# Patient Record
Sex: Female | Born: 1960 | Race: White | Hispanic: No | Smoking: Never smoker
Health system: Southern US, Community
[De-identification: ages and names within clinical notes are randomized; demographics above are authoritative.]

## PROBLEM LIST (undated history)

## (undated) DIAGNOSIS — F32A Depression, unspecified: Secondary | ICD-10-CM

## (undated) DIAGNOSIS — K219 Gastro-esophageal reflux disease without esophagitis: Secondary | ICD-10-CM

## (undated) DIAGNOSIS — Z21 Asymptomatic human immunodeficiency virus [HIV] infection status: Secondary | ICD-10-CM

## (undated) DIAGNOSIS — B20 Human immunodeficiency virus [HIV] disease: Secondary | ICD-10-CM

## (undated) DIAGNOSIS — Z972 Presence of dental prosthetic device (complete) (partial): Secondary | ICD-10-CM

## (undated) HISTORY — PX: CHOLECYSTECTOMY: SHX55

## (undated) HISTORY — PX: COLONOSCOPY: SHX174

---

## 2004-11-07 ENCOUNTER — Emergency Department: Payer: Self-pay | Admitting: Emergency Medicine

## 2008-03-18 ENCOUNTER — Emergency Department: Payer: Self-pay | Admitting: Emergency Medicine

## 2008-07-09 ENCOUNTER — Ambulatory Visit: Payer: Self-pay | Admitting: Family Medicine

## 2008-07-23 ENCOUNTER — Ambulatory Visit: Payer: Self-pay | Admitting: Family Medicine

## 2009-10-17 ENCOUNTER — Emergency Department: Payer: Self-pay | Admitting: Emergency Medicine

## 2011-02-28 ENCOUNTER — Emergency Department: Payer: Self-pay | Admitting: *Deleted

## 2011-08-30 ENCOUNTER — Ambulatory Visit: Payer: Self-pay | Admitting: Family Medicine

## 2013-03-17 ENCOUNTER — Ambulatory Visit: Payer: Self-pay | Admitting: Family Medicine

## 2015-02-03 ENCOUNTER — Other Ambulatory Visit: Payer: Self-pay | Admitting: Student

## 2015-02-03 DIAGNOSIS — R945 Abnormal results of liver function studies: Principal | ICD-10-CM

## 2015-02-03 DIAGNOSIS — R7989 Other specified abnormal findings of blood chemistry: Secondary | ICD-10-CM

## 2015-02-10 ENCOUNTER — Ambulatory Visit
Admission: RE | Admit: 2015-02-10 | Discharge: 2015-02-10 | Disposition: A | Discharge status: Home / Self Care | Payer: Medicare Other | Source: Ambulatory Visit | Attending: Student | Admitting: Student

## 2015-02-10 DIAGNOSIS — R945 Abnormal results of liver function studies: Secondary | ICD-10-CM | POA: Diagnosis not present

## 2015-02-10 DIAGNOSIS — K760 Fatty (change of) liver, not elsewhere classified: Secondary | ICD-10-CM | POA: Insufficient documentation

## 2015-02-10 DIAGNOSIS — R7989 Other specified abnormal findings of blood chemistry: Secondary | ICD-10-CM

## 2015-02-25 ENCOUNTER — Other Ambulatory Visit: Payer: Self-pay | Admitting: Family Medicine

## 2015-02-25 DIAGNOSIS — Z1239 Encounter for other screening for malignant neoplasm of breast: Secondary | ICD-10-CM

## 2015-03-09 ENCOUNTER — Ambulatory Visit: Payer: Medicare Other

## 2016-05-15 ENCOUNTER — Other Ambulatory Visit: Payer: Self-pay | Admitting: Family Medicine

## 2016-05-15 DIAGNOSIS — Z1231 Encounter for screening mammogram for malignant neoplasm of breast: Secondary | ICD-10-CM

## 2016-06-09 ENCOUNTER — Ambulatory Visit
Admission: RE | Admit: 2016-06-09 | Discharge: 2016-06-09 | Disposition: A | Discharge status: Home / Self Care | Payer: Medicare Other | Source: Ambulatory Visit | Attending: Family Medicine | Admitting: Family Medicine

## 2016-06-09 DIAGNOSIS — Z1231 Encounter for screening mammogram for malignant neoplasm of breast: Secondary | ICD-10-CM | POA: Diagnosis not present

## 2016-09-12 ENCOUNTER — Other Ambulatory Visit: Payer: Self-pay | Admitting: Family Medicine

## 2016-09-12 DIAGNOSIS — B182 Chronic viral hepatitis C: Secondary | ICD-10-CM

## 2016-09-21 ENCOUNTER — Ambulatory Visit
Admission: RE | Admit: 2016-09-21 | Discharge: 2016-09-21 | Disposition: A | Discharge status: Home / Self Care | Payer: Medicare Other | Source: Ambulatory Visit | Attending: Family Medicine | Admitting: Family Medicine

## 2016-09-21 DIAGNOSIS — B182 Chronic viral hepatitis C: Secondary | ICD-10-CM | POA: Diagnosis not present

## 2017-02-02 ENCOUNTER — Other Ambulatory Visit: Payer: Self-pay | Admitting: Family Medicine

## 2017-02-02 DIAGNOSIS — K7460 Unspecified cirrhosis of liver: Secondary | ICD-10-CM

## 2017-03-27 ENCOUNTER — Ambulatory Visit: Payer: Medicare Other

## 2017-04-04 ENCOUNTER — Ambulatory Visit
Admission: RE | Admit: 2017-04-04 | Discharge: 2017-04-04 | Disposition: A | Discharge status: Home / Self Care | Payer: Medicare Other | Source: Ambulatory Visit | Attending: Family Medicine | Admitting: Family Medicine

## 2017-04-04 DIAGNOSIS — B182 Chronic viral hepatitis C: Secondary | ICD-10-CM | POA: Diagnosis not present

## 2017-04-04 DIAGNOSIS — K7460 Unspecified cirrhosis of liver: Secondary | ICD-10-CM

## 2017-11-02 ENCOUNTER — Other Ambulatory Visit: Payer: Self-pay | Admitting: Family Medicine

## 2017-11-02 DIAGNOSIS — Z1231 Encounter for screening mammogram for malignant neoplasm of breast: Secondary | ICD-10-CM

## 2018-03-30 IMAGING — MG MM DIGITAL SCREENING BILAT W/ TOMO W/ CAD
8 of 12 series · 8 of 28 positions shown · non-contrast
Comparison: Previous exam(s).

CLINICAL DATA: Screening.

EXAM:
2D DIGITAL SCREENING BILATERAL MAMMOGRAM WITH CAD AND ADJUNCT TOMO

[L CC]
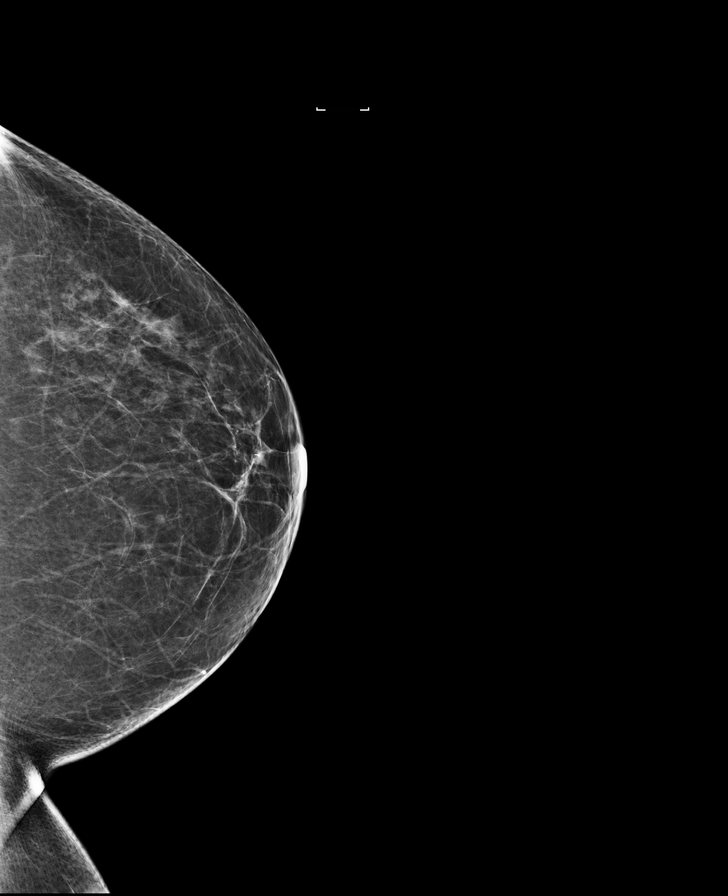

[R CC]
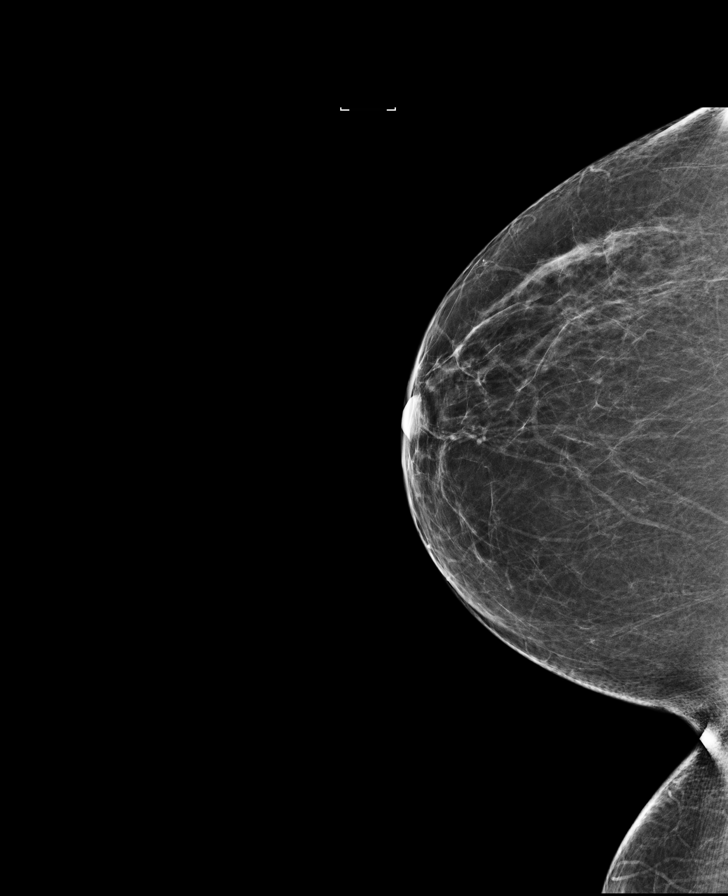

[L CC synth-2D]
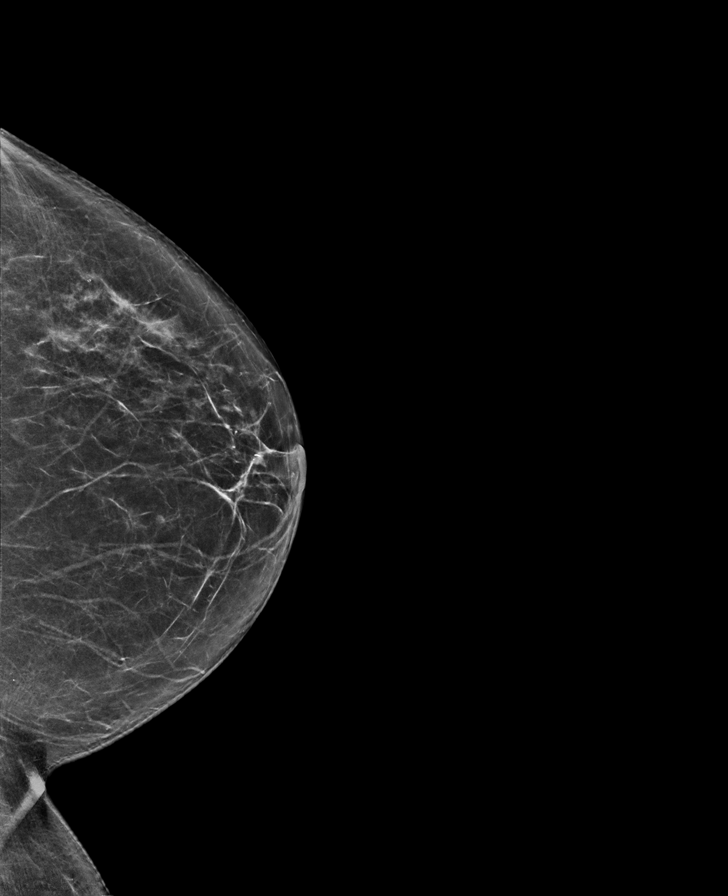

[R MLO]
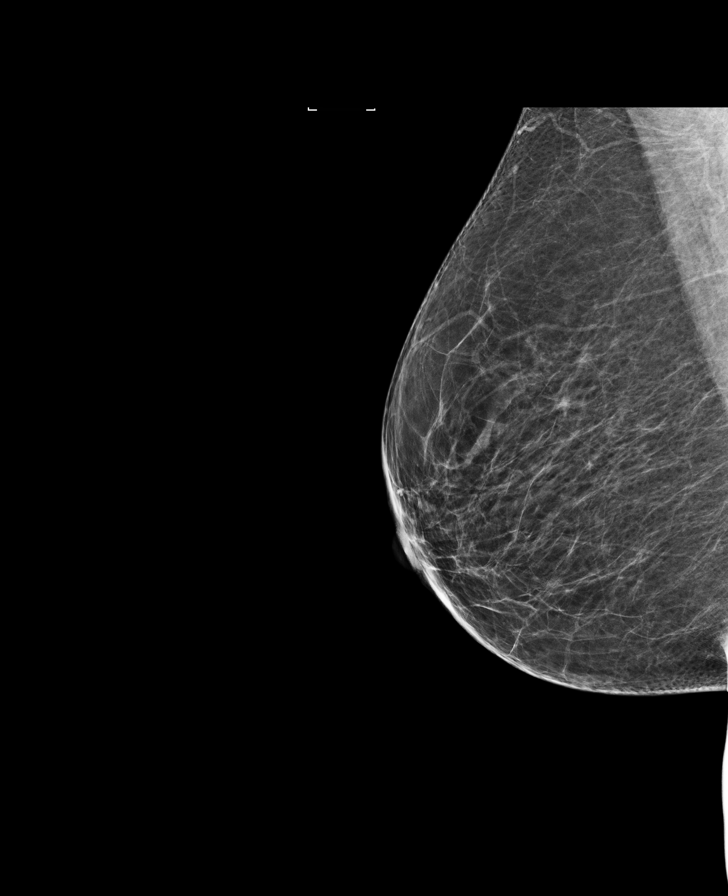

[R CC synth-2D]
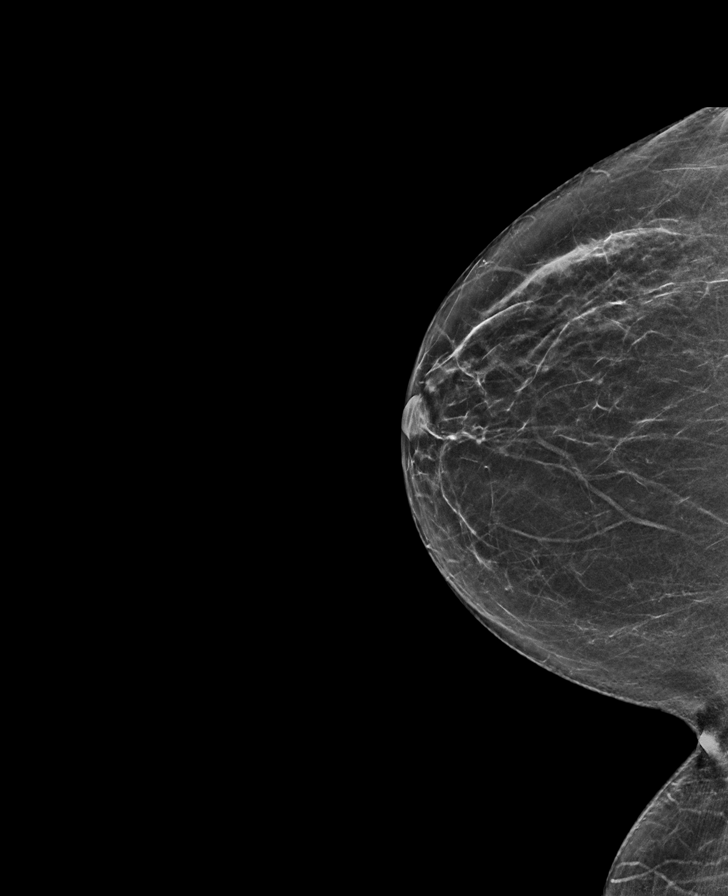

[L MLO]
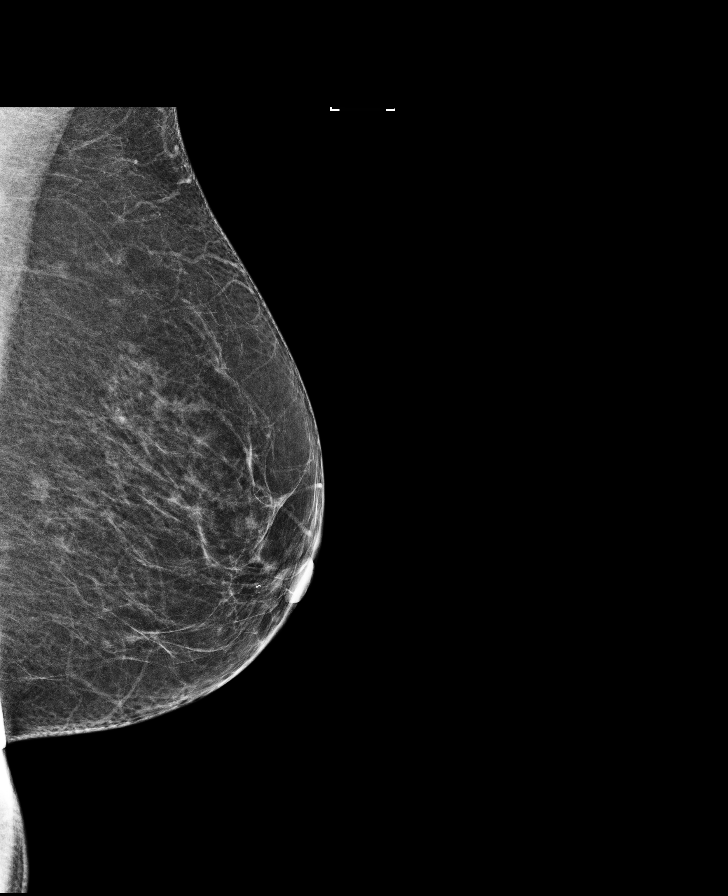

[R MLO synth-2D]
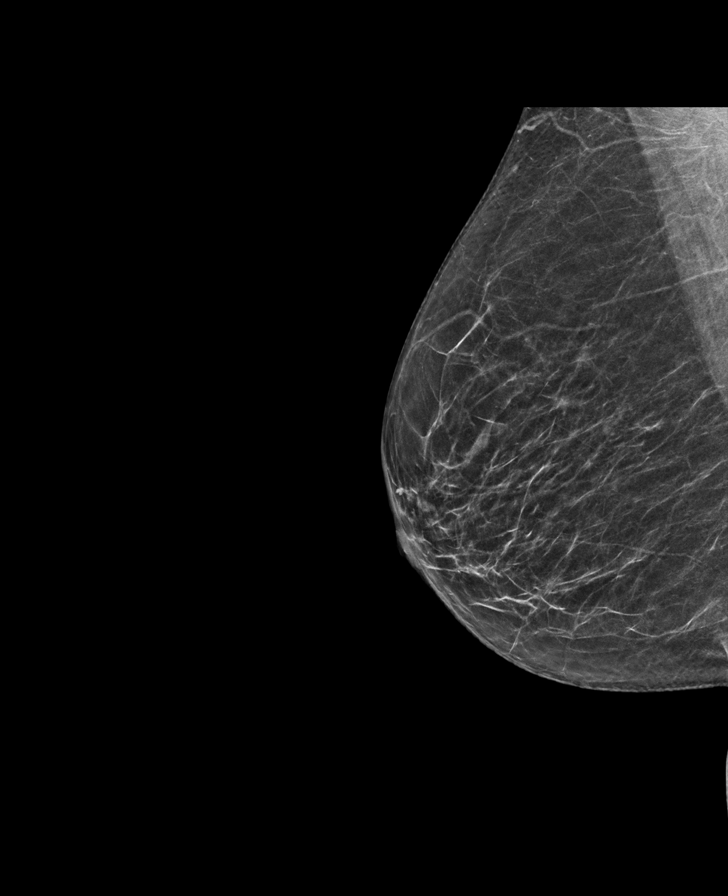

[L MLO synth-2D]
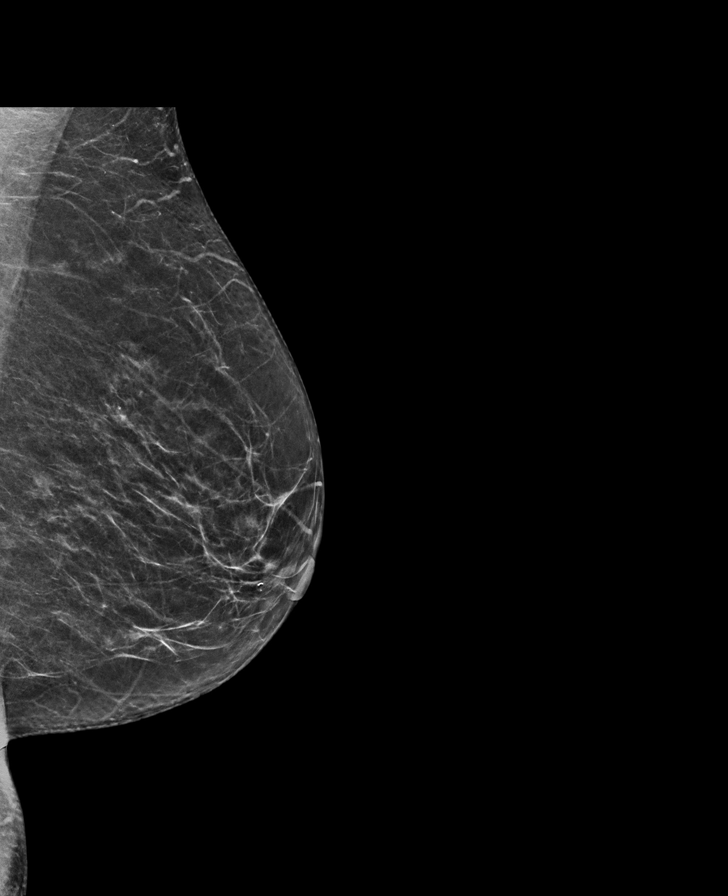

[8 of 28 positions shown; findings below may reference images not displayed]

ACR Breast Density Category b: There are scattered areas of
fibroglandular density.
FINDINGS: There are no findings suspicious for malignancy. Images were
processed with CAD.
IMPRESSION: No mammographic evidence of malignancy. A result letter of this
screening mammogram will be mailed directly to the patient.

RECOMMENDATION:
Screening mammogram in one year. (Code:97-6-RS4)

BI-RADS CATEGORY  1: Negative.

## 2018-08-05 ENCOUNTER — Other Ambulatory Visit: Payer: Self-pay | Admitting: Registered Nurse

## 2018-08-05 DIAGNOSIS — Z8719 Personal history of other diseases of the digestive system: Secondary | ICD-10-CM

## 2018-08-13 ENCOUNTER — Other Ambulatory Visit: Payer: Self-pay

## 2018-08-13 ENCOUNTER — Ambulatory Visit
Admission: RE | Admit: 2018-08-13 | Discharge: 2018-08-13 | Disposition: A | Discharge status: Home / Self Care | Payer: Medicare Other | Source: Ambulatory Visit | Attending: Registered Nurse | Admitting: Registered Nurse

## 2018-08-13 DIAGNOSIS — Z8719 Personal history of other diseases of the digestive system: Secondary | ICD-10-CM | POA: Diagnosis not present

## 2018-08-15 ENCOUNTER — Other Ambulatory Visit: Payer: Self-pay

## 2018-08-15 ENCOUNTER — Ambulatory Visit (INDEPENDENT_AMBULATORY_CARE_PROVIDER_SITE_OTHER): Payer: Medicare Other | Admitting: Gastroenterology

## 2018-08-15 ENCOUNTER — Encounter: Payer: Self-pay | Admitting: Gastroenterology

## 2018-08-15 VITALS — BP 121/83 | HR 64 | Resp 16 | Ht 62.0 in | Wt 179.4 lb

## 2018-08-15 DIAGNOSIS — K219 Gastro-esophageal reflux disease without esophagitis: Secondary | ICD-10-CM | POA: Diagnosis not present

## 2018-08-15 DIAGNOSIS — R1310 Dysphagia, unspecified: Secondary | ICD-10-CM

## 2018-08-15 DIAGNOSIS — R1319 Other dysphagia: Secondary | ICD-10-CM

## 2018-08-15 NOTE — Patient Instructions (Signed)

## 2018-08-15 NOTE — Progress Notes (Addendum)
Cephas Darby, MD [address]  [place]  [place], [place] [address]  Main: [phone]  Fax: [phone]    Gastroenterology Consultation  Referring Provider:     Alwyn Pea, NP Primary Care Physician:  Destiny Median, MD Primary Gastroenterologist:  Dr. Cephas Darby Reason for Consultation:  Reflux, dysphagia to solids        HPI:   Destiny Hernandez is a 58 y.o. female referred by Dr. Rebeca Hernandez, Durene Cal, MD  for consultation & management of chronic reflux   Patient reports that she has been experiencing several years history of heartburn for which she was taking Nexium 40 mg which kept her symptoms in remission.  She was told that she has hiatal hernia after she underwent EGD in 02/2015.  For the last 2 weeks, her reflux symptoms have worsened, including nocturnal heartburn that causes awakening from her sleep, she has been experiencing sensation of food stuck in her chest particularly with hard meats.  She does consume carbonated beverages, sweetened tea regularly.  She does report burping, belching, abdominal distention.  Her PCP has tried 2 different PPIs which did not provide relief.  Changed to a different one which she has not picked up yet.  She does not know the name of the PPI she is on.  She denies weight loss, nausea or vomiting.  She also reports having had a colonoscopy about 4 to 5 years ago.  Report not available.  She said she had labs performed 2 weeks ago by her PCP, I do not have copy of the results.  She does have fatty liver, elevated LFTs in the past from 01/2015.  She had an ultrasound abdomen on 08/13/2018 which revealed fatty liver only, no splenomegaly, patent portal vasculature  NSAIDs: None  Antiplts/Anticoagulants/Anti thrombotics: None  GI Procedures: EGD and colonoscopy in 2016, report not available  No past medical history on file.    Current Outpatient Medications:  .  esomeprazole (NEXIUM) 40 MG capsule, , Disp: , Rfl:   .  traZODone (DESYREL) 50 MG tablet, , Disp: , Rfl:  .  gabapentin (NEURONTIN) 300 MG capsule, Take by mouth., Disp: , Rfl:  .  nitrofurantoin, macrocrystal-monohydrate, (MACROBID) 100 MG capsule, , Disp: , Rfl:  .  oseltamivir (TAMIFLU) 75 MG capsule, , Disp: , Rfl:    No family history on file.   Social History   Tobacco Use  . Smoking status: Never Smoker  . Smokeless tobacco: Never Used  Substance Use Topics  . Alcohol use: Never    Frequency: Never  . Drug use: Never    Allergies as of 08/15/2018  . (No Known Allergies)    Review of Systems:    All systems reviewed and negative except where noted in HPI.   Physical Exam:  BP 121/83 (BP Location: Left Arm, Patient Position: Sitting, Cuff Size: Large)   Pulse 64   Resp 16   Ht 5\' 2"  (1.575 m)   Wt 179 lb 6.4 oz (81.4 kg)   BMI 32.81 kg/m  No LMP recorded.  General:   Hernandez,  Well-developed, well-nourished, pleasant and cooperative in NAD Head:  Normocephalic and atraumatic. Eyes:  Sclera clear, no icterus.   Conjunctiva pink. Ears:  Normal auditory acuity. Nose:  No deformity, discharge, or lesions. Mouth:  No deformity or lesions,oropharynx pink & moist. Neck:  Supple; no masses or thyromegaly. Lungs:  Respirations even and unlabored.  Clear throughout to auscultation.   No wheezes, crackles,  or rhonchi. No acute distress. Heart:  Regular rate and rhythm; no murmurs, clicks, rubs, or gallops. Abdomen:  Normal bowel sounds. Soft, non-tender and non-distended without masses, hepatosplenomegaly or hernias noted.  No guarding or rebound tenderness.   Rectal: Not performed Msk:  Symmetrical without gross deformities. Good, equal movement & strength bilaterally. Pulses:  Normal pulses noted. Extremities:  No clubbing or edema.  No cyanosis. Neurologic:  Hernandez and oriented x3;  grossly normal neurologically. Skin:  Intact without significant lesions or rashes. No jaundice. Psych:  Hernandez and cooperative. Normal mood  and affect.  Imaging Studies: Reviewed  Assessment and Plan:   Destiny Hernandez is a 58 y.o. female with chronic reflux, fatty liver presents with flareup of reflux, dysphagia to solids particularly hard meat  GERD, dysphagia Recommend to continue PPI as prescribed by her PCP Recommend EGD for further evaluation  Elevated LFTs, ultrasound revealed fatty liver We will obtain copy of the most recent lab results from her PCPs office Further work-up after reviewing the labs  Colon cancer screening We will request copy of colonoscopy from 2016   Follow up in 2 months   Cephas Darby, MD

## 2018-08-20 ENCOUNTER — Telehealth: Payer: Self-pay | Admitting: Gastroenterology

## 2018-08-20 ENCOUNTER — Telehealth: Payer: Self-pay

## 2018-08-20 ENCOUNTER — Other Ambulatory Visit: Payer: Self-pay

## 2018-08-20 DIAGNOSIS — R1319 Other dysphagia: Secondary | ICD-10-CM

## 2018-08-20 DIAGNOSIS — R1310 Dysphagia, unspecified: Secondary | ICD-10-CM

## 2018-08-20 DIAGNOSIS — K219 Gastro-esophageal reflux disease without esophagitis: Secondary | ICD-10-CM

## 2018-08-20 NOTE — Telephone Encounter (Signed)
Patients EGD has been rescheduled to [place] for 09/04/18.  Procedure moved from 08/26/18 [facility].  Thanks [employer]

## 2018-08-20 NOTE — Telephone Encounter (Signed)
Pt left vm she is returning  A phone call

## 2018-08-20 NOTE — Telephone Encounter (Signed)
Pt spoke with Sharyn Lull concerning rescheduling procedure previously scheduled for 08/26/2018

## 2018-08-26 ENCOUNTER — Ambulatory Visit: Admit: 2018-08-26 | Payer: Medicare Other | Admitting: Gastroenterology

## 2018-08-26 ENCOUNTER — Ambulatory Visit: Payer: Medicare Other | Admitting: Gastroenterology

## 2018-08-26 SURGERY — ESOPHAGOGASTRODUODENOSCOPY (EGD) WITH PROPOFOL
Anesthesia: General

## 2018-08-27 ENCOUNTER — Telehealth: Payer: Self-pay | Admitting: Gastroenterology

## 2018-08-27 NOTE — Telephone Encounter (Signed)
Doree Albee sister is calling trying to help patient who does not understand the dates or instructions. She is currently scheduled for a procedure in the [place] office on 09-04-2018. Helene Kelp is on the [facility] for patient. Please advise.

## 2018-08-27 NOTE — Telephone Encounter (Signed)
Lowanda Foster called about patient & she is on patient's DPR. I have called & l/m for Lowanda Foster to return call.

## 2018-08-27 NOTE — Telephone Encounter (Signed)
Procedure date provided.  COVID testing information provided.  She said they may need to reschedule due to family matters going on.  Will call back if they need to.  Thanks [employer]

## 2018-08-29 ENCOUNTER — Encounter: Payer: Self-pay | Admitting: *Deleted

## 2018-08-29 ENCOUNTER — Other Ambulatory Visit: Payer: Self-pay

## 2018-08-30 ENCOUNTER — Other Ambulatory Visit
Admission: RE | Admit: 2018-08-30 | Discharge: 2018-08-30 | Disposition: A | Discharge status: Home / Self Care | Payer: Medicare Other | Source: Ambulatory Visit | Attending: Gastroenterology | Admitting: Gastroenterology

## 2018-08-30 DIAGNOSIS — Z1159 Encounter for screening for other viral diseases: Secondary | ICD-10-CM | POA: Diagnosis not present

## 2018-08-30 DIAGNOSIS — Z01812 Encounter for preprocedural laboratory examination: Secondary | ICD-10-CM | POA: Insufficient documentation

## 2018-08-31 LAB — NOVEL CORONAVIRUS, NAA (HOSP ORDER, SEND-OUT TO REF LAB; TAT 18-24 HRS): SARS-CoV-2, NAA: NOT DETECTED

## 2018-09-03 NOTE — Discharge Instructions (Signed)
General Anesthesia, Adult, Care After  This sheet gives you information about how to care for yourself after your procedure. Your health care provider may also give you more specific instructions. If you have problems or questions, contact your health care provider.  What can I expect after the procedure?  After the procedure, the following side effects are common:  Pain or discomfort at the IV site.  Nausea.  Vomiting.  Sore throat.  Trouble concentrating.  Feeling cold or chills.  Weak or tired.  Sleepiness and fatigue.  Soreness and body aches. These side effects can affect parts of the body that were not involved in surgery.  Follow these instructions at home:    For at least 24 hours after the procedure:  Have a responsible adult stay with you. It is important to have someone help care for you until you are awake and alert.  Rest as needed.  Do not:  Participate in activities in which you could fall or become injured.  Drive.  Use heavy machinery.  Drink alcohol.  Take sleeping pills or medicines that cause drowsiness.  Make important decisions or sign legal documents.  Take care of children on your own.  Eating and drinking  Follow any instructions from your health care provider about eating or drinking restrictions.  When you feel hungry, start by eating small amounts of foods that are soft and easy to digest (bland), such as toast. Gradually return to your regular diet.  Drink enough fluid to keep your urine pale yellow.  If you vomit, rehydrate by drinking water, juice, or clear broth.  General instructions  If you have sleep apnea, surgery and certain medicines can increase your risk for breathing problems. Follow instructions from your health care provider about wearing your sleep device:  Anytime you are sleeping, including during daytime naps.  While taking prescription pain medicines, sleeping medicines, or medicines that make you drowsy.  Return to your normal activities as told by your health care  provider. Ask your health care provider what activities are safe for you.  Take over-the-counter and prescription medicines only as told by your health care provider.  If you smoke, do not smoke without supervision.  Keep all follow-up visits as told by your health care provider. This is important.  Contact a health care provider if:  You have nausea or vomiting that does not get better with medicine.  You cannot eat or drink without vomiting.  You have pain that does not get better with medicine.  You are unable to pass urine.  You develop a skin rash.  You have a fever.  You have redness around your IV site that gets worse.  Get help right away if:  You have difficulty breathing.  You have chest pain.  You have blood in your urine or stool, or you vomit blood.  Summary  After the procedure, it is common to have a sore throat or nausea. It is also common to feel tired.  Have a responsible adult stay with you for the first 24 hours after general anesthesia. It is important to have someone help care for you until you are awake and alert.  When you feel hungry, start by eating small amounts of foods that are soft and easy to digest (bland), such as toast. Gradually return to your regular diet.  Drink enough fluid to keep your urine pale yellow.  Return to your normal activities as told by your health care provider. Ask your health care   provider what activities are safe for you.  This information is not intended to replace advice given to you by your health care provider. Make sure you discuss any questions you have with your health care provider.  Document Released: 06/12/2000 Document Revised: 10/20/2016 Document Reviewed: 10/20/2016  Elsevier Interactive Patient Education  2019 [employer] Inc[employer]

## 2018-09-04 ENCOUNTER — Ambulatory Visit: Payer: Medicare Other | Admitting: Anesthesiology

## 2018-09-04 ENCOUNTER — Ambulatory Visit
Admission: RE | Admit: 2018-09-04 | Discharge: 2018-09-04 | Disposition: A | Discharge status: Home / Self Care | Payer: Medicare Other | Attending: Gastroenterology | Admitting: Gastroenterology

## 2018-09-04 ENCOUNTER — Other Ambulatory Visit: Payer: Self-pay

## 2018-09-04 ENCOUNTER — Encounter
Admission: RE | Disposition: A | Discharge status: Home / Self Care | Payer: Self-pay | Source: Home / Self Care | Attending: Gastroenterology

## 2018-09-04 DIAGNOSIS — K219 Gastro-esophageal reflux disease without esophagitis: Secondary | ICD-10-CM

## 2018-09-04 DIAGNOSIS — Z79899 Other long term (current) drug therapy: Secondary | ICD-10-CM | POA: Diagnosis not present

## 2018-09-04 DIAGNOSIS — R1319 Other dysphagia: Secondary | ICD-10-CM

## 2018-09-04 DIAGNOSIS — Z21 Asymptomatic human immunodeficiency virus [HIV] infection status: Secondary | ICD-10-CM | POA: Insufficient documentation

## 2018-09-04 DIAGNOSIS — R1310 Dysphagia, unspecified: Secondary | ICD-10-CM | POA: Diagnosis not present

## 2018-09-04 HISTORY — DX: Presence of dental prosthetic device (complete) (partial): Z97.2

## 2018-09-04 HISTORY — DX: Asymptomatic human immunodeficiency virus (hiv) infection status: Z21

## 2018-09-04 HISTORY — DX: Human immunodeficiency virus (HIV) disease: B20

## 2018-09-04 HISTORY — PX: ESOPHAGOGASTRODUODENOSCOPY (EGD) WITH PROPOFOL: SHX5813

## 2018-09-04 SURGERY — ESOPHAGOGASTRODUODENOSCOPY (EGD) WITH PROPOFOL
Anesthesia: General

## 2018-09-04 MED ORDER — DEXMEDETOMIDINE HCL 200 MCG/2ML IV SOLN
INTRAVENOUS | Status: DC | PRN
  Administered 2018-09-04: 6 ug via INTRAVENOUS

## 2018-09-04 MED ORDER — LIDOCAINE HCL (CARDIAC) PF 100 MG/5ML IV SOSY
PREFILLED_SYRINGE | INTRAVENOUS | Status: DC | PRN
  Administered 2018-09-04: 40 mg via INTRAVENOUS

## 2018-09-04 MED ORDER — LACTATED RINGERS IV SOLN
INTRAVENOUS | Status: DC
  Administered 2018-09-04: 08:00:00 via INTRAVENOUS

## 2018-09-04 MED ORDER — PROPOFOL 10 MG/ML IV BOLUS
INTRAVENOUS | Status: DC | PRN
  Administered 2018-09-04 (×2): 50 mg via INTRAVENOUS
  Administered 2018-09-04: 20 mg via INTRAVENOUS
  Administered 2018-09-04: 100 mg via INTRAVENOUS

## 2018-09-04 MED ORDER — STERILE WATER FOR IRRIGATION IR SOLN
Status: DC | PRN
  Administered 2018-09-04: 5 mL

## 2018-09-04 MED ORDER — OXYCODONE HCL 5 MG/5ML PO SOLN: 5.0000 mg | Freq: Once | ORAL | Status: DC | PRN

## 2018-09-04 MED ORDER — GLYCOPYRROLATE 0.2 MG/ML IJ SOLN
INTRAMUSCULAR | Status: DC | PRN
  Administered 2018-09-04: 0.1 mg via INTRAVENOUS

## 2018-09-04 MED ORDER — OXYCODONE HCL 5 MG PO TABS: 5.0000 mg | ORAL_TABLET | Freq: Once | ORAL | Status: DC | PRN

## 2018-09-04 SURGICAL SUPPLY — 33 items
BALLN DILATOR 10-12 8 (BALLOONS)
BALLN DILATOR 12-15 8 (BALLOONS)
BALLN DILATOR 15-18 8 (BALLOONS)
BALLN DILATOR CRE 0-12 8 (BALLOONS)
BALLN DILATOR ESOPH 8 10 CRE (MISCELLANEOUS) IMPLANT
BALLOON DILATOR 12-15 8 (BALLOONS) IMPLANT
BALLOON DILATOR 15-18 8 (BALLOONS) IMPLANT
BALLOON DILATOR CRE 0-12 8 (BALLOONS) IMPLANT
BLOCK BITE 60FR ADLT L/F GRN (MISCELLANEOUS) ×3 IMPLANT
CANISTER SUCT 1200ML W/VALVE (MISCELLANEOUS) ×3 IMPLANT
CLIP HMST 235XBRD CATH ROT (MISCELLANEOUS) IMPLANT
CLIP RESOLUTION 360 11X235 (MISCELLANEOUS)
ELECT REM PT RETURN 9FT ADLT (ELECTROSURGICAL)
ELECTRODE REM PT RTRN 9FT ADLT (ELECTROSURGICAL) IMPLANT
FCP ESCP3.2XJMB 240X2.8X (MISCELLANEOUS) ×1
FORCEPS BIOP RAD 4 LRG CAP 4 (CUTTING FORCEPS) IMPLANT
FORCEPS BIOP RJ4 240 W/NDL (MISCELLANEOUS) ×2
FORCEPS ESCP3.2XJMB 240X2.8X (MISCELLANEOUS) ×1 IMPLANT
GOWN CVR UNV OPN BCK APRN NK (MISCELLANEOUS) ×2 IMPLANT
GOWN ISOL THUMB LOOP REG UNIV (MISCELLANEOUS) ×4
INJECTOR VARIJECT VIN23 (MISCELLANEOUS) IMPLANT
KIT DEFENDO VALVE AND CONN (KITS) IMPLANT
KIT ENDO PROCEDURE OLY (KITS) ×3 IMPLANT
MARKER SPOT ENDO TATTOO 5ML (MISCELLANEOUS) IMPLANT
RETRIEVER NET PLAT FOOD (MISCELLANEOUS) IMPLANT
SNARE SHORT THROW 13M SML OVAL (MISCELLANEOUS) IMPLANT
SNARE SHORT THROW 30M LRG OVAL (MISCELLANEOUS) IMPLANT
SPOT EX ENDOSCOPIC TATTOO (MISCELLANEOUS)
SYR INFLATION 60ML (SYRINGE) IMPLANT
TRAP ETRAP POLY (MISCELLANEOUS) IMPLANT
VARIJECT INJECTOR VIN23 (MISCELLANEOUS)
WATER STERILE IRR 250ML POUR (IV SOLUTION) ×3 IMPLANT
WIRE CRE 18-20MM 8CM F G (MISCELLANEOUS) IMPLANT

## 2018-09-04 NOTE — Anesthesia Preprocedure Evaluation (Signed)
Anesthesia Evaluation  Patient identified by MRN, date of birth, ID band  Reviewed: NPO status   History of Anesthesia Complications Negative for: history of anesthetic complications  Airway Mallampati: II  TM Distance: >3 FB Neck ROM: full    Dental  (+) Upper Dentures, Lower Dentures   Pulmonary neg pulmonary ROS,    Pulmonary exam normal        Cardiovascular Exercise Tolerance: Good negative cardio ROS Normal cardiovascular exam     Neuro/Psych negative neurological ROS  negative psych ROS   GI/Hepatic Neg liver ROS, GERD  Controlled,  Endo/Other  negative endocrine ROS  Renal/GU negative Renal ROS  negative genitourinary   Musculoskeletal   Abdominal   Peds  Hematology  (+) HIV,   Anesthesia Other Findings Covid: NEG;  Reproductive/Obstetrics                             Anesthesia Physical Anesthesia Plan  ASA: II  Anesthesia Plan: General   Post-op Pain Management:    Induction:   PONV Risk Score and Plan:   Airway Management Planned:   Additional Equipment:   Intra-op Plan:   Post-operative Plan:   Informed Consent: I have reviewed the patients History and Physical, chart, labs and discussed the procedure including the risks, benefits and alternatives for the proposed anesthesia with the patient or authorized representative who has indicated his/her understanding and acceptance.       Plan Discussed with: CRNA  Anesthesia Plan Comments:         Anesthesia Quick Evaluation

## 2018-09-04 NOTE — H&P (Signed)
Cephas Darby, MD [address]  [place]  [place], [place] [address]  Main: [phone]  Fax: [phone] Pager: [phone]  Primary Care Physician:  Letta Median, MD Primary Gastroenterologist:  Dr. Cephas Darby  Pre-Procedure History & Physical: HPI:  Destiny Hernandez is a 58 y.o. female is here for an endoscopy.   Past Medical History:  Diagnosis Date  . HIV (human immunodeficiency virus infection) ([place])   . Wears dentures    full upper and lower, doesn't wear    Past Surgical History:  Procedure Laterality Date  . COLONOSCOPY      Prior to Admission medications   Medication Sig Start Date End Date Taking? Authorizing Provider  ACAI BERRY PO Take by mouth daily.   Yes [provider]  bictegravir-emtricitabine-tenofovir AF (BIKTARVY) 50-200-25 MG TABS tablet Take by mouth daily.   Yes [provider]  omeprazole (PRILOSEC) 20 MG capsule Take 20 mg by mouth daily.   Yes [provider]  traZODone (DESYREL) 50 MG tablet  08/02/18  Yes [provider]  esomeprazole ([place]) 40 MG capsule  04/12/18   [provider]    Allergies as of 08/20/2018  . (No Known Allergies)    History reviewed. No pertinent family history.  Social History   Socioeconomic History  . Marital status: Divorced    Spouse name: Not on file  . Number of children: Not on file  . Years of education: Not on file  . Highest education level: Not on file  Occupational History  . Not on file  Social Needs  . Financial resource strain: Not on file  . Food insecurity    Worry: Not on file    Inability: Not on file  . Transportation needs    Medical: Not on file    Non-medical: Not on file  Tobacco Use  . Smoking status: Never Smoker  . Smokeless tobacco: Never Used  Substance and Sexual Activity  . Alcohol use: Never    Frequency: Never  . Drug use: Never  . Sexual activity: Not Currently    Partners: Male  Lifestyle  .  Physical activity    Days per week: Not on file    Minutes per session: Not on file  . Stress: Not on file  Relationships  . Social Herbalist on phone: Not on file    Gets together: Not on file    Attends religious service: Not on file    Active member of club or organization: Not on file    Attends meetings of clubs or organizations: Not on file    Relationship status: Not on file  . Intimate partner violence    Fear of current or ex partner: Not on file    Emotionally abused: Not on file    Physically abused: Not on file    Forced sexual activity: Not on file  Other Topics Concern  . Not on file  Social History Narrative  . Not on file    Review of Systems: See HPI, otherwise negative ROS  Physical Exam: BP (!) 157/75   Pulse 71   Temp (!) 97.5 F (36.4 C) (Temporal)   Resp 16   Ht 5\' 2"  (1.575 m)   Wt 81.6 kg   SpO2 99%   BMI 32.90 kg/m  General:   Alert,  pleasant and cooperative in NAD Head:  Normocephalic and atraumatic. Neck:  Supple; no masses or thyromegaly. Lungs:  Clear  throughout to auscultation.    Heart:  Regular rate and rhythm. Abdomen:  Soft, nontender and nondistended. Normal bowel sounds, without guarding, and without rebound.   Neurologic:  Alert and  oriented x4;  grossly normal neurologically.  Impression/Plan: Destiny Hernandez is here for an endoscopy to be performed for chronic gerd and dysphagia  Risks, benefits, limitations, and alternatives regarding  endoscopy have been reviewed with the patient.  Questions have been answered.  All parties agreeable.   Sherri Sear, MD  09/04/2018, 8:11 AM

## 2018-09-04 NOTE — Transfer of Care (Signed)
Immediate Anesthesia Transfer of Care Note  Patient: Destiny Hernandez  Procedure(s) Performed: ESOPHAGOGASTRODUODENOSCOPY (EGD) WITH PROPOFOL (N/A )  Patient Location: PACU  Anesthesia Type: General  Level of Consciousness: awake, alert  and patient cooperative  Airway and Oxygen Therapy: Patient Spontanous Breathing and Patient connected to supplemental oxygen  Post-op Assessment: Post-op Vital signs reviewed, Patient's Cardiovascular Status Stable, Respiratory Function Stable, Patent Airway and No signs of Nausea or vomiting  Post-op Vital Signs: Reviewed and stable  Complications: No apparent anesthesia complications

## 2018-09-04 NOTE — Anesthesia Procedure Notes (Signed)
Procedure Name: MAC Date/Time: 09/04/2018 8:50 AM Performed by: Janna Arch, CRNA Pre-anesthesia Checklist: Patient identified, Emergency Drugs available, Suction available, Timeout performed and Patient being monitored Patient Re-evaluated:Patient Re-evaluated prior to induction Oxygen Delivery Method: Nasal cannula Placement Confirmation: positive ETCO2

## 2018-09-04 NOTE — Anesthesia Postprocedure Evaluation (Signed)
Anesthesia Post Note  Patient: Destiny Hernandez  Procedure(s) Performed: ESOPHAGOGASTRODUODENOSCOPY (EGD) WITH PROPOFOL (N/A )  Patient location during evaluation: PACU Anesthesia Type: General Level of consciousness: awake and alert Pain management: pain level controlled Vital Signs Assessment: post-procedure vital signs reviewed and stable Respiratory status: spontaneous breathing, nonlabored ventilation, respiratory function stable and patient connected to nasal cannula oxygen Cardiovascular status: blood pressure returned to baseline and stable Postop Assessment: no apparent nausea or vomiting Anesthetic complications: no    Devonne Lalani

## 2018-09-04 NOTE — Op Note (Signed)
[facility] Gastroenterology Patient Name: Destiny Hernandez Procedure Date: 09/04/2018 8:46 AM MRN: [id number] Account #: [id number] Date of Birth: 02-08-1961 Admit Type: Outpatient Age: 58 Room: [facility] OR ROOM 01 Gender: Female Note Status: Finalized Procedure:            Upper GI endoscopy Indications:          Dysphagia, Esophageal reflux symptoms that persist                        despite appropriate therapy Providers:            Lin Landsman MD, MD Referring MD:         Baxter Kail. Rebeca Alert MD, MD (Referring MD) Medicines:            Monitored Anesthesia Care Complications:        No immediate complications. Estimated blood loss: None. Procedure:            Pre-Anesthesia Assessment:                       - Prior to the procedure, a History and Physical was                        performed, and patient medications and allergies were                        reviewed. The patient is competent. The risks and                        benefits of the procedure and the sedation options and                        risks were discussed with the patient. All questions                        were answered and informed consent was obtained.                        Patient identification and proposed procedure were                        verified by the physician, the nurse, the                        anesthesiologist, the anesthetist and the technician in                        the pre-procedure area in the procedure room in the                        endoscopy suite. Mental Status Examination: alert and                        oriented. Airway Examination: normal oropharyngeal                        airway and neck mobility. Respiratory Examination:                        clear to auscultation. CV Examination: normal.  Prophylactic Antibiotics: The patient does not require                        prophylactic antibiotics. Prior Anticoagulants: The                patient has taken no previous anticoagulant or                        antiplatelet agents. ASA Grade Assessment: II - A                        patient with mild systemic disease. After reviewing the                        risks and benefits, the patient was deemed in                        satisfactory condition to undergo the procedure. The                        anesthesia plan was to use monitored anesthesia care                        (MAC). Immediately prior to administration of                        medications, the patient was re-assessed for adequacy                        to receive sedatives. The heart rate, respiratory rate,                        oxygen saturations, blood pressure, adequacy of                        pulmonary ventilation, and response to care were                        monitored throughout the procedure. The physical status                        of the patient was re-assessed after the procedure.                       After obtaining informed consent, the endoscope was                        passed under direct vision. Throughout the procedure,                        the patient's blood pressure, pulse, and oxygen                        saturations were monitored continuously. The                        Endosonoscope was introduced through the mouth, and                        advanced to the second part of duodenum. The upper  GI                        endoscopy was accomplished without difficulty. The                        patient tolerated the procedure fairly well. Findings:      The duodenal bulb and second portion of the duodenum were normal.      The entire examined stomach was normal. Biopsies were taken with a cold       forceps for Helicobacter pylori testing.      The cardia and gastric fundus were normal on retroflexion.      Esophagogastric landmarks were identified: the gastroesophageal junction       was found at 36 cm from the  incisors.      The gastroesophageal junction and examined esophagus were normal.       Biopsies were taken with a cold forceps for histology. Impression:           - Normal duodenal bulb and second portion of the                        duodenum.                       - Normal stomach. Biopsied.                       - Esophagogastric landmarks identified.                       - Normal gastroesophageal junction and esophagus.                        Biopsied. Recommendation:       - Await pathology results.                       - Discharge patient to home (with escort).                       - Resume previous diet today.                       - Continue present medications.                       - Follow an antireflux regimen. Procedure Code(s):    --- Professional ---                       [phone], Esophagogastroduodenoscopy, flexible, transoral;                        with biopsy, single or multiple Diagnosis Code(s):    --- Professional ---                       R13.10, Dysphagia, unspecified                       K21.9, Gastro-esophageal reflux disease without                        esophagitis CPT copyright 2019 American Medical Association. All rights reserved. The codes documented in  this report are preliminary and upon coder review may  be revised to meet current compliance requirements. Dr. Ulyess Mort Lin Landsman MD, MD 09/04/2018 9:03:59 AM This report has been signed electronically. Number of Addenda: 0 Note Initiated On: 09/04/2018 8:46 AM Total Procedure Duration: 0 hours 5 minutes 58 seconds  Estimated Blood Loss: Estimated blood loss: none.      [facility]

## 2018-09-05 ENCOUNTER — Encounter: Payer: Self-pay | Admitting: Gastroenterology

## 2018-09-10 ENCOUNTER — Encounter: Payer: Self-pay | Admitting: Gastroenterology

## 2018-10-15 ENCOUNTER — Ambulatory Visit: Payer: Medicare Other | Admitting: Gastroenterology

## 2018-12-02 ENCOUNTER — Ambulatory Visit: Payer: Medicare Other | Admitting: Gastroenterology

## 2018-12-13 ENCOUNTER — Other Ambulatory Visit: Payer: Self-pay | Admitting: Registered Nurse

## 2018-12-13 DIAGNOSIS — Z1231 Encounter for screening mammogram for malignant neoplasm of breast: Secondary | ICD-10-CM

## 2019-04-15 ENCOUNTER — Ambulatory Visit
Admission: RE | Admit: 2019-04-15 | Discharge: 2019-04-15 | Disposition: A | Discharge status: Home / Self Care | Payer: Medicare Other | Source: Ambulatory Visit | Attending: Registered Nurse | Admitting: Registered Nurse

## 2019-04-15 DIAGNOSIS — Z1231 Encounter for screening mammogram for malignant neoplasm of breast: Secondary | ICD-10-CM | POA: Diagnosis not present

## 2019-05-14 ENCOUNTER — Other Ambulatory Visit: Payer: Self-pay

## 2019-05-14 ENCOUNTER — Telehealth: Payer: Self-pay

## 2019-05-14 ENCOUNTER — Encounter: Payer: Self-pay | Admitting: Physician Assistant

## 2019-05-14 DIAGNOSIS — Z1211 Encounter for screening for malignant neoplasm of colon: Secondary | ICD-10-CM

## 2019-05-14 DIAGNOSIS — R195 Other fecal abnormalities: Secondary | ICD-10-CM

## 2019-05-14 NOTE — Telephone Encounter (Signed)
Gastroenterology Pre-Procedure Review  Request Date: 06/16/19 Requesting Physician: Dr. Marius Ditch  PATIENT REVIEW QUESTIONS: The patient responded to the following health history questions as indicated:    1. Are you having any GI issues? no 2. Do you have a personal history of Polyps? no 3. Do you have a family history of Colon Cancer or Polyps? no 4. Diabetes Mellitus? no 5. Joint replacements in the past 12 months?no 6. Major health problems in the past 3 months?no 7. Any artificial heart valves, MVP, or defibrillator?no    MEDICATIONS & ALLERGIES:    Patient reports the following regarding taking any anticoagulation/antiplatelet therapy:   Plavix, Coumadin, Eliquis, Xarelto, Lovenox, Pradaxa, Brilinta, or Effient? no Aspirin? no  Patient confirms/reports the following medications:  Current Outpatient Medications  Medication Sig Dispense Refill  . ACAI BERRY PO Take by mouth daily.    . bictegravir-emtricitabine-tenofovir AF (BIKTARVY) 50-200-25 MG TABS tablet Take by mouth daily.    Marland Kitchen esomeprazole (NEXIUM) 40 MG capsule     . omeprazole (PRILOSEC) 20 MG capsule Take 20 mg by mouth daily.    . traZODone (DESYREL) 50 MG tablet      No current facility-administered medications for this visit.    Patient confirms/reports the following allergies:  No Known Allergies  No orders of the defined types were placed in this encounter.   AUTHORIZATION INFORMATION Primary Insurance: 1D#: Group #:  Secondary Insurance: 1D#: Group #:  SCHEDULE INFORMATION: Date: Friday 06/06/19 Time: Location:[facility]

## 2019-05-14 NOTE — Telephone Encounter (Signed)
Returned call to patient to schedule colonoscopy.  LVM for pt to call office to schedule.  Thanks,  [place], [place]

## 2019-06-04 ENCOUNTER — Other Ambulatory Visit: Payer: Self-pay

## 2019-06-04 ENCOUNTER — Other Ambulatory Visit
Admission: RE | Admit: 2019-06-04 | Discharge: 2019-06-04 | Disposition: A | Discharge status: Home / Self Care | Payer: Medicare Other | Source: Ambulatory Visit | Attending: Gastroenterology | Admitting: Gastroenterology

## 2019-06-04 DIAGNOSIS — Z01812 Encounter for preprocedural laboratory examination: Secondary | ICD-10-CM | POA: Insufficient documentation

## 2019-06-04 DIAGNOSIS — Z20822 Contact with and (suspected) exposure to covid-19: Secondary | ICD-10-CM | POA: Insufficient documentation

## 2019-06-04 LAB — SARS CORONAVIRUS 2 (TAT 6-24 HRS): SARS Coronavirus 2: NEGATIVE

## 2019-06-06 ENCOUNTER — Encounter: Payer: Self-pay | Admitting: Gastroenterology

## 2019-06-06 ENCOUNTER — Ambulatory Visit
Admission: RE | Admit: 2019-06-06 | Discharge: 2019-06-06 | Disposition: A | Discharge status: Home / Self Care | Payer: Medicare Other | Attending: Gastroenterology | Admitting: Gastroenterology

## 2019-06-06 ENCOUNTER — Ambulatory Visit: Payer: Medicare Other | Admitting: Registered Nurse

## 2019-06-06 ENCOUNTER — Other Ambulatory Visit: Payer: Self-pay

## 2019-06-06 ENCOUNTER — Encounter
Admission: RE | Disposition: A | Discharge status: Home / Self Care | Payer: Self-pay | Source: Home / Self Care | Attending: Gastroenterology

## 2019-06-06 DIAGNOSIS — Z1211 Encounter for screening for malignant neoplasm of colon: Secondary | ICD-10-CM

## 2019-06-06 DIAGNOSIS — K635 Polyp of colon: Secondary | ICD-10-CM

## 2019-06-06 DIAGNOSIS — R195 Other fecal abnormalities: Secondary | ICD-10-CM

## 2019-06-06 DIAGNOSIS — D124 Benign neoplasm of descending colon: Secondary | ICD-10-CM | POA: Diagnosis not present

## 2019-06-06 DIAGNOSIS — Z21 Asymptomatic human immunodeficiency virus [HIV] infection status: Secondary | ICD-10-CM | POA: Insufficient documentation

## 2019-06-06 DIAGNOSIS — K5730 Diverticulosis of large intestine without perforation or abscess without bleeding: Secondary | ICD-10-CM | POA: Diagnosis not present

## 2019-06-06 DIAGNOSIS — D122 Benign neoplasm of ascending colon: Secondary | ICD-10-CM | POA: Diagnosis not present

## 2019-06-06 DIAGNOSIS — Z79899 Other long term (current) drug therapy: Secondary | ICD-10-CM | POA: Insufficient documentation

## 2019-06-06 HISTORY — PX: COLONOSCOPY WITH PROPOFOL: SHX5780

## 2019-06-06 SURGERY — COLONOSCOPY WITH PROPOFOL
Anesthesia: General

## 2019-06-06 MED ORDER — LIDOCAINE HCL (CARDIAC) PF 100 MG/5ML IV SOSY
PREFILLED_SYRINGE | INTRAVENOUS | Status: DC | PRN
  Administered 2019-06-06: 20 mg via INTRAVENOUS
  Administered 2019-06-06: 80 mg via INTRAVENOUS

## 2019-06-06 MED ORDER — LIDOCAINE HCL (PF) 2 % IJ SOLN
INTRAMUSCULAR | Status: AC
  Filled 2019-06-06: qty 20

## 2019-06-06 MED ORDER — SODIUM CHLORIDE 0.9 % IV SOLN: INTRAVENOUS | Status: DC

## 2019-06-06 MED ORDER — PROPOFOL 10 MG/ML IV BOLUS
INTRAVENOUS | Status: DC | PRN
  Administered 2019-06-06: 80 mg via INTRAVENOUS
  Administered 2019-06-06: 20 mg via INTRAVENOUS

## 2019-06-06 MED ORDER — PROPOFOL 500 MG/50ML IV EMUL
INTRAVENOUS | Status: AC
  Filled 2019-06-06: qty 100

## 2019-06-06 MED ORDER — PROPOFOL 500 MG/50ML IV EMUL
INTRAVENOUS | Status: DC | PRN
  Administered 2019-06-06: 125 ug/kg/min via INTRAVENOUS

## 2019-06-06 NOTE — Transfer of Care (Signed)
Immediate Anesthesia Transfer of Care Note  Patient: Destiny Hernandez  Procedure(s) Performed: COLONOSCOPY WITH PROPOFOL (N/A )  Patient Location: Endoscopy Unit  Anesthesia Type:General  Level of Consciousness: drowsy  Airway & Oxygen Therapy: Patient Spontanous Breathing  Post-op Assessment: Report given to RN and Post -op Vital signs reviewed and stable  Post vital signs: Reviewed and stable  Last Vitals:  Vitals Value Taken Time  BP 103/63 06/06/19 1047  Temp    Pulse 95 06/06/19 1047  Resp 20 06/06/19 1047  SpO2 96 % 06/06/19 1047    Last Pain:  Vitals:   06/06/19 0932  TempSrc: Temporal  PainSc: 0-No pain         Complications: No apparent anesthesia complications

## 2019-06-06 NOTE — Anesthesia Preprocedure Evaluation (Signed)
Anesthesia Evaluation  Patient identified by MRN, date of birth, ID band  Reviewed: NPO status   History of Anesthesia Complications Negative for: history of anesthetic complications  Airway Mallampati: II  TM Distance: >3 FB Neck ROM: full    Dental  (+) Upper Dentures, Lower Dentures   Pulmonary neg pulmonary ROS,    Pulmonary exam normal        Cardiovascular Exercise Tolerance: Good negative cardio ROS Normal cardiovascular exam     Neuro/Psych negative neurological ROS  negative psych ROS   GI/Hepatic Neg liver ROS, GERD  Controlled,  Endo/Other  negative endocrine ROS  Renal/GU negative Renal ROS  negative genitourinary   Musculoskeletal   Abdominal   Peds  Hematology  (+) HIV,   Anesthesia Other Findings Covid: NEG;  Reproductive/Obstetrics                             Anesthesia Physical  Anesthesia Plan  ASA: II  Anesthesia Plan: General   Post-op Pain Management:    Induction: Intravenous  PONV Risk Score and Plan: Propofol infusion  Airway Management Planned: Nasal Cannula  Additional Equipment:   Intra-op Plan:   Post-operative Plan:   Informed Consent: I have reviewed the patients History and Physical, chart, labs and discussed the procedure including the risks, benefits and alternatives for the proposed anesthesia with the patient or authorized representative who has indicated his/her understanding and acceptance.       Plan Discussed with: CRNA  Anesthesia Plan Comments:         Anesthesia Quick Evaluation

## 2019-06-06 NOTE — H&P (Signed)
Cephas Darby, MD [address]  [place]  [place], [place] [address]  Main: [phone]  Fax: [phone] Pager: [phone]  Primary Care Physician:  Letta Median, MD Primary Gastroenterologist:  Dr. Cephas Darby  Pre-Procedure History & Physical: HPI:  Destiny Hernandez is a 59 y.o. female is here for an colonoscopy.   Past Medical History:  Diagnosis Date   HIV (human immunodeficiency virus infection) ([place])    Wears dentures    full upper and lower, doesn't wear    Past Surgical History:  Procedure Laterality Date   COLONOSCOPY     ESOPHAGOGASTRODUODENOSCOPY (EGD) WITH PROPOFOL N/A 09/04/2018   Procedure: ESOPHAGOGASTRODUODENOSCOPY (EGD) WITH PROPOFOL;  Surgeon: Lin Landsman, MD;  Location: [facility];  Service: Endoscopy;  Laterality: N/A;    Prior to Admission medications   Medication Sig Start Date End Date Taking? Authorizing Provider  ACAI BERRY PO Take by mouth daily.    [provider]  bictegravir-emtricitabine-tenofovir AF (BIKTARVY) 50-200-25 MG TABS tablet Take by mouth daily.    [provider]  esomeprazole (NEXIUM) 40 MG capsule  04/12/18   [provider]  omeprazole (PRILOSEC) 20 MG capsule Take 20 mg by mouth daily.    [provider]  traZODone (DESYREL) 50 MG tablet  08/02/18   [provider]    Allergies as of 05/14/2019   (No Known Allergies)    History reviewed. No pertinent family history.  Social History   Socioeconomic History   Marital status: Divorced    Spouse name: Not on file   Number of children: Not on file   Years of education: Not on file   Highest education level: Not on file  Occupational History   Not on file  Tobacco Use   Smoking status: Never Smoker   Smokeless tobacco: Never Used  Substance and Sexual Activity   Alcohol use: Never   Drug use: Never   Sexual activity: Not Currently    Partners: Male  Other Topics Concern   Not on file    Social History Narrative   Not on file   Social Determinants of Health   Financial Resource Strain:    Difficulty of Paying Living Expenses:   Food Insecurity:    Worried About Charity fundraiser in the Last Year:    Arboriculturist in the Last Year:   Transportation Needs:    Film/video editor (Medical):    Lack of Transportation (Non-Medical):   Physical Activity:    Days of Exercise per Week:    Minutes of Exercise per Session:   Stress:    Feeling of Stress :   Social Connections:    Frequency of Communication with Friends and Family:    Frequency of Social Gatherings with Friends and Family:    Attends Religious Services:    Active Member of Clubs or Organizations:    Attends Music therapist:    Marital Status:   Intimate Partner Violence:    Fear of Current or Ex-Partner:    Emotionally Abused:    Physically Abused:    Sexually Abused:     Review of Systems: See HPI, otherwise negative ROS  Physical Exam: BP (!) 140/98   Pulse 90   Temp (!) 96.9 F (36.1 C) (Temporal)   Resp 18   Ht 5\' 2"  (1.575 m)   Wt 190 lb (86.2 kg)   SpO2 99%   BMI 34.75 kg/m  General:  Alert,  pleasant and cooperative in NAD Head:  Normocephalic and atraumatic. Neck:  Supple; no masses or thyromegaly. Lungs:  Clear throughout to auscultation.    Heart:  Regular rate and rhythm. Abdomen:  Soft, nontender and nondistended. Normal bowel sounds, without guarding, and without rebound.   Neurologic:  Alert and  oriented x4;  grossly normal neurologically.  Impression/Plan: Destiny Hernandez is here for an colonoscopy to be performed for colon cancer screening  Risks, benefits, limitations, and alternatives regarding  colonoscopy have been reviewed with the patient.  Questions have been answered.  All parties agreeable.   Sherri Sear, MD  06/06/2019, 10:11 AM

## 2019-06-06 NOTE — Op Note (Signed)
[facility] Gastroenterology Patient Name: Destiny Hernandez Procedure Date: 06/06/2019 10:15 AM MRN: [id number] Account #: [id number] Date of Birth: 05/07/1960 Admit Type: Outpatient Age: 59 Room: [facility] ENDO ROOM 3 Gender: Female Note Status: Finalized Procedure:             Colonoscopy Indications:           Screening for colorectal malignant neoplasm Providers:             Lin Landsman MD, MD Medicines:             Monitored Anesthesia Care Complications:         No immediate complications. Estimated blood loss: None. Procedure:             Pre-Anesthesia Assessment:                        - Prior to the procedure, a History and Physical was                         performed, and patient medications and allergies were                         reviewed. The patient is competent. The risks and                         benefits of the procedure and the sedation options and                         risks were discussed with the patient. All questions                         were answered and informed consent was obtained.                         Patient identification and proposed procedure were                         verified by the physician, the nurse, the                         anesthesiologist, the anesthetist and the technician                         in the pre-procedure area in the procedure room in the                         endoscopy suite. Mental Status Examination: alert and                         oriented. Airway Examination: normal oropharyngeal                         airway and neck mobility. Respiratory Examination:                         clear to auscultation. CV Examination: normal.                         Prophylactic Antibiotics: The patient does not  require                         prophylactic antibiotics. Prior Anticoagulants: The                         patient has taken no previous anticoagulant or                         antiplatelet  agents. ASA Grade Assessment: II - A                         patient with mild systemic disease. After reviewing                         the risks and benefits, the patient was deemed in                         satisfactory condition to undergo the procedure. The                         anesthesia plan was to use monitored anesthesia care                         (MAC). Immediately prior to administration of                         medications, the patient was re-assessed for adequacy                         to receive sedatives. The heart rate, respiratory                         rate, oxygen saturations, blood pressure, adequacy of                         pulmonary ventilation, and response to care were                         monitored throughout the procedure. The physical                         status of the patient was re-assessed after the                         procedure.                        After obtaining informed consent, the colonoscope was                         passed under direct vision. Throughout the procedure,                         the patient's blood pressure, pulse, and oxygen                         saturations were monitored continuously. The  Colonoscope was introduced through the anus and                         advanced to the the terminal ileum, with                         identification of the appendiceal orifice and IC                         valve. The colonoscopy was performed without                         difficulty. The patient tolerated the procedure well.                         The quality of the bowel preparation was evaluated                         using the BBPS [facility] Bowel Preparation Scale) with                         scores of: Right Colon = 3, Transverse Colon = 3 and                         Left Colon = 3 (entire mucosa seen well with no                         residual staining, small fragments of stool or  opaque                         liquid). The total BBPS score equals 9. Findings:      The perianal and digital rectal examinations were normal. Pertinent       negatives include normal sphincter tone and no palpable rectal lesions.      A 5 mm polyp was found in the ascending colon. The polyp was sessile.       The polyp was removed with a cold snare. Resection and retrieval were       complete.      A 8 mm polyp was found in the descending colon. The polyp was sessile.       The polyp was removed with a hot snare. Resection and retrieval were       complete.      A few diverticula were found in the sigmoid colon.      The retroflexed view of the distal rectum and anal verge was normal and       showed no anal or rectal abnormalities. Impression:            - One 5 mm polyp in the ascending colon, removed with                         a cold snare. Resected and retrieved.                        - One 8 mm polyp in the descending colon, removed with  a hot snare. Resected and retrieved.                        - Diverticulosis in the sigmoid colon.                        - The distal rectum and anal verge are normal on                         retroflexion view. Recommendation:        - Discharge patient to home (with escort).                        - Resume previous diet today.                        - Continue present medications.                        - Await pathology results.                        - Repeat colonoscopy in 5 years for surveillance based                         on pathology results. Procedure Code(s):     --- Professional ---                        [phone], Colonoscopy, flexible; with removal of                         tumor(s), polyp(s), or other lesion(s) by snare                         technique Diagnosis Code(s):     --- Professional ---                        Z12.11, Encounter for screening for malignant neoplasm                         of  colon                        K63.5, Polyp of colon                        K57.30, Diverticulosis of large intestine without                         perforation or abscess without bleeding CPT copyright 2019 American Medical Association. All rights reserved. The codes documented in this report are preliminary and upon coder review may  be revised to meet current compliance requirements. Dr. Ulyess Mort Lin Landsman MD, MD 06/06/2019 10:43:57 AM This report has been signed electronically. Number of Addenda: 0 Note Initiated On: 06/06/2019 10:15 AM Scope Withdrawal Time: 0 hours 12 minutes 58 seconds  Total Procedure Duration: 0 hours 16 minutes 28 seconds  Estimated Blood Loss:  Estimated blood loss: none.      [facility]

## 2019-06-06 NOTE — Anesthesia Postprocedure Evaluation (Signed)
Anesthesia Post Note  Patient: Destiny Hernandez  Procedure(s) Performed: COLONOSCOPY WITH PROPOFOL (N/A )  Patient location during evaluation: Endoscopy Anesthesia Type: General Level of consciousness: awake and alert and oriented Pain management: pain level controlled Vital Signs Assessment: post-procedure vital signs reviewed and stable Respiratory status: spontaneous breathing Cardiovascular status: blood pressure returned to baseline Anesthetic complications: no     Last Vitals:  Vitals:   06/06/19 1120 06/06/19 1121  BP: 111/60   Pulse: 74 74  Resp: 18 18  Temp:    SpO2: 99% 99%    Last Pain:  Vitals:   06/06/19 1047  TempSrc: Tympanic  PainSc:                  [name]

## 2019-06-09 ENCOUNTER — Encounter: Payer: Self-pay | Admitting: *Deleted

## 2019-06-09 ENCOUNTER — Encounter: Payer: Self-pay | Admitting: Gastroenterology

## 2019-06-09 LAB — SURGICAL PATHOLOGY

## 2020-04-15 ENCOUNTER — Other Ambulatory Visit: Payer: Self-pay

## 2021-02-07 ENCOUNTER — Other Ambulatory Visit: Payer: Self-pay | Admitting: Nurse Practitioner

## 2021-02-07 DIAGNOSIS — Z1231 Encounter for screening mammogram for malignant neoplasm of breast: Secondary | ICD-10-CM

## 2021-03-15 ENCOUNTER — Ambulatory Visit: Payer: Medicare Other | Admitting: Anesthesiology

## 2021-03-15 ENCOUNTER — Ambulatory Visit
Admission: RE | Admit: 2021-03-15 | Discharge: 2021-03-15 | Disposition: A | Discharge status: Home / Self Care | Payer: Medicare Other | Attending: Gastroenterology | Admitting: Gastroenterology

## 2021-03-15 ENCOUNTER — Encounter
Admission: RE | Disposition: A | Discharge status: Home / Self Care | Payer: Self-pay | Source: Home / Self Care | Attending: Gastroenterology

## 2021-03-15 DIAGNOSIS — K2100 Gastro-esophageal reflux disease with esophagitis, without bleeding: Secondary | ICD-10-CM | POA: Insufficient documentation

## 2021-03-15 DIAGNOSIS — K7460 Unspecified cirrhosis of liver: Secondary | ICD-10-CM | POA: Diagnosis not present

## 2021-03-15 DIAGNOSIS — Z8619 Personal history of other infectious and parasitic diseases: Secondary | ICD-10-CM | POA: Insufficient documentation

## 2021-03-15 DIAGNOSIS — F32A Depression, unspecified: Secondary | ICD-10-CM | POA: Diagnosis not present

## 2021-03-15 DIAGNOSIS — Z21 Asymptomatic human immunodeficiency virus [HIV] infection status: Secondary | ICD-10-CM | POA: Diagnosis not present

## 2021-03-15 DIAGNOSIS — K2950 Unspecified chronic gastritis without bleeding: Secondary | ICD-10-CM | POA: Insufficient documentation

## 2021-03-15 DIAGNOSIS — R1310 Dysphagia, unspecified: Secondary | ICD-10-CM | POA: Diagnosis not present

## 2021-03-15 HISTORY — DX: Gastro-esophageal reflux disease without esophagitis: K21.9

## 2021-03-15 HISTORY — DX: Depression, unspecified: F32.A

## 2021-03-15 HISTORY — PX: ESOPHAGOGASTRODUODENOSCOPY: SHX5428

## 2021-03-15 SURGERY — EGD (ESOPHAGOGASTRODUODENOSCOPY)
Anesthesia: General

## 2021-03-15 MED ORDER — PROPOFOL 10 MG/ML IV BOLUS
INTRAVENOUS | Status: DC | PRN
  Administered 2021-03-15: 100 mg via INTRAVENOUS
  Administered 2021-03-15 (×2): 20 mg via INTRAVENOUS

## 2021-03-15 MED ORDER — GLYCOPYRROLATE 0.2 MG/ML IJ SOLN
INTRAMUSCULAR | Status: AC
  Filled 2021-03-15: qty 1

## 2021-03-15 MED ORDER — PHENYLEPHRINE 40 MCG/ML (10ML) SYRINGE FOR IV PUSH (FOR BLOOD PRESSURE SUPPORT)
PREFILLED_SYRINGE | INTRAVENOUS | Status: DC | PRN
  Administered 2021-03-15: 80 ug via INTRAVENOUS

## 2021-03-15 MED ORDER — SODIUM CHLORIDE 0.9 % IV SOLN
INTRAVENOUS | Status: DC
  Administered 2021-03-15: 08:00:00 20 mL/h via INTRAVENOUS

## 2021-03-15 MED ORDER — PROPOFOL 500 MG/50ML IV EMUL
INTRAVENOUS | Status: AC
  Filled 2021-03-15: qty 50

## 2021-03-15 MED ORDER — LIDOCAINE HCL (CARDIAC) PF 100 MG/5ML IV SOSY
PREFILLED_SYRINGE | INTRAVENOUS | Status: DC | PRN
  Administered 2021-03-15: 50 mg via INTRAVENOUS

## 2021-03-15 MED ORDER — DEXMEDETOMIDINE HCL IN NACL 200 MCG/50ML IV SOLN
INTRAVENOUS | Status: DC | PRN
  Administered 2021-03-15 (×2): 4 ug via INTRAVENOUS

## 2021-03-15 MED ORDER — PROPOFOL 10 MG/ML IV BOLUS
INTRAVENOUS | Status: AC
  Filled 2021-03-15: qty 20

## 2021-03-15 MED ORDER — GLYCOPYRROLATE 0.2 MG/ML IJ SOLN
INTRAMUSCULAR | Status: DC | PRN
  Administered 2021-03-15: .2 mg via INTRAVENOUS

## 2021-03-15 MED ORDER — DEXMEDETOMIDINE HCL IN NACL 200 MCG/50ML IV SOLN
INTRAVENOUS | Status: AC
  Filled 2021-03-15: qty 50

## 2021-03-15 NOTE — H&P (Signed)
Outpatient short stay form Pre-procedure 03/15/2021  Lesly Rubenstein, MD  Primary Physician: Letta Median, MD  Reason for visit:  Dysphagia  History of present illness:   60 y/o lady with HIV and HCV s/p treatment and SVR here for chronic dysphagia. Had EGD in 2020 with normal biopsies. Dysphagia to only solids and not liquids. History of cholecystectomy.    Current Facility-Administered Medications:    0.9 %  sodium chloride infusion, , Intravenous, Continuous, Ulices Maack, Hilton Cork, MD, Last Rate: 20 mL/hr at 03/15/21 1007, Continued from Pre-op at 03/15/21 1219  Facility-Administered Medications Ordered in Other Encounters:    dexmedetomidine (PRECEDEX) 200 MCG/50ML (4 mcg/mL) infusion, , Intravenous, Anesthesia Intra-op, Jonna Clark, CRNA, 4 mcg at 03/15/21 7588   glycopyrrolate (ROBINUL) injection, , Intravenous, Anesthesia Intra-op, Jonna Clark, CRNA, 0.2 mg at 03/15/21 3254   lidocaine (cardiac) 100 mg/52mL (XYLOCAINE) injection 2%, , Intravenous, Anesthesia Intra-op, Jonna Clark, CRNA, 50 mg at 03/15/21 9826  Medications Prior to Admission  Medication Sig Dispense Refill Last Dose   ACAI BERRY PO Take by mouth daily.   Past Week   bictegravir-emtricitabine-tenofovir AF (BIKTARVY) 50-200-25 MG TABS tablet Take by mouth daily.   03/14/2021   esomeprazole (NEXIUM) 40 MG capsule    03/14/2021   omeprazole (PRILOSEC) 20 MG capsule Take 20 mg by mouth daily.   03/14/2021   traZODone (DESYREL) 50 MG tablet    03/14/2021     Allergies  Allergen Reactions   Omeprazole      Past Medical History:  Diagnosis Date   Depression    GERD (gastroesophageal reflux disease)    HIV (human immunodeficiency virus infection) ([place])    Wears dentures    full upper and lower, doesn't wear    Review of systems:  Otherwise negative.    Physical Exam  Gen: Alert, oriented. Appears stated age.  HEENT:PERRLA. Lungs: No respiratory distress CV: RRR Abd: soft,  benign, no masses Ext: No edema    Planned procedures: Proceed with EGD. The patient understands the nature of the planned procedure, indications, risks, alternatives and potential complications including but not limited to bleeding, infection, perforation, damage to internal organs and possible oversedation/side effects from anesthesia. The patient agrees and gives consent to proceed.  Please refer to procedure notes for findings, recommendations and patient disposition/instructions.     Lesly Rubenstein, MD [facility] Gastroenterology

## 2021-03-15 NOTE — Anesthesia Postprocedure Evaluation (Signed)
Anesthesia Post Note  Patient: Destiny Hernandez  Procedure(s) Performed: ESOPHAGOGASTRODUODENOSCOPY (EGD)  Patient location during evaluation: Phase II Anesthesia Type: General Level of consciousness: awake and alert, awake and oriented Pain management: pain level controlled Vital Signs Assessment: post-procedure vital signs reviewed and stable Respiratory status: spontaneous breathing, nonlabored ventilation and respiratory function stable Cardiovascular status: blood pressure returned to baseline and stable Postop Assessment: no apparent nausea or vomiting Anesthetic complications: no   No notable events documented.   Last Vitals:  Vitals:   03/15/21 0958 03/15/21 1000  BP: 127/80 107/77  Pulse:  80  Resp: 16 16  Temp: (!) 35.7 C   SpO2: 99% 93%    Last Pain:  Vitals:   03/15/21 0756  TempSrc: Temporal  PainSc: 0-No pain                 Phill Mutter

## 2021-03-15 NOTE — Transfer of Care (Signed)
Immediate Anesthesia Transfer of Care Note  Patient: Destiny Hernandez  Procedure(s) Performed: ESOPHAGOGASTRODUODENOSCOPY (EGD)  Patient Location: PACU and Endoscopy Unit  Anesthesia Type:General  Level of Consciousness: drowsy and patient cooperative  Airway & Oxygen Therapy: Patient Spontanous Breathing  Post-op Assessment: Report given to RN and Post -op Vital signs reviewed and stable  Post vital signs: Reviewed and stable  Last Vitals:  Vitals Value Taken Time  BP 127/80 03/15/21 0958  Temp    Pulse 81 03/15/21 0958  Resp 16 03/15/21 0958  SpO2 93 % 03/15/21 0958  Vitals shown include unvalidated device data.  Last Pain:  Vitals:   03/15/21 0756  TempSrc: Temporal  PainSc: 0-No pain         Complications: No notable events documented.

## 2021-03-15 NOTE — Anesthesia Preprocedure Evaluation (Signed)
Anesthesia Evaluation  Patient identified by MRN, date of birth, ID band  Reviewed: NPO status   History of Anesthesia Complications Negative for: history of anesthetic complications  Airway Mallampati: II  TM Distance: >3 FB Neck ROM: full    Dental  (+) Upper Dentures, Lower Dentures   Pulmonary neg pulmonary ROS,    Pulmonary exam normal        Cardiovascular Exercise Tolerance: Good negative cardio ROS Normal cardiovascular exam     Neuro/Psych PSYCHIATRIC DISORDERS Depression negative neurological ROS     GI/Hepatic GERD  Controlled,(+) Cirrhosis       ,   Endo/Other  negative endocrine ROS  Renal/GU negative Renal ROS  negative genitourinary   Musculoskeletal   Abdominal   Peds  Hematology  (+) HIV,   Anesthesia Other Findings Depression    GERD (gastroesophageal reflux disease)   HIV (human immunodeficiency virus infection  Wears dentures  full upper and lower, doesn't wear     Reproductive/Obstetrics                             Anesthesia Physical  Anesthesia Plan  ASA: 3  Anesthesia Plan: General   Post-op Pain Management:    Induction: Intravenous  PONV Risk Score and Plan: Propofol infusion and TIVA  Airway Management Planned: Nasal Cannula and Natural Airway  Additional Equipment:   Intra-op Plan:   Post-operative Plan:   Informed Consent: I have reviewed the patients History and Physical, chart, labs and discussed the procedure including the risks, benefits and alternatives for the proposed anesthesia with the patient or authorized representative who has indicated his/her understanding and acceptance.       Plan Discussed with: CRNA, Anesthesiologist and Surgeon  Anesthesia Plan Comments:         Anesthesia Quick Evaluation

## 2021-03-15 NOTE — Interval H&P Note (Signed)
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  The various methods of treatment have been discussed with the patient and family. After consideration of risks, benefits and other options for treatment, the patient has consented to  Procedure(s): ESOPHAGOGASTRODUODENOSCOPY (EGD) (N/A) as a surgical intervention.  The patient's history has been reviewed, patient examined, no change in status, stable for surgery.  I have reviewed the patient's chart and labs.  Questions were answered to the patient's satisfaction.     Lesly Rubenstein  Ok to proceed with EGD

## 2021-03-15 NOTE — Op Note (Signed)
[facility] Gastroenterology Patient Name: Destiny Hernandez Procedure Date: 03/15/2021 9:19 AM MRN: [id number] Account #: [id number] Date of Birth: 1961/02/17 Admit Type: Outpatient Age: 60 Room: [facility] ENDO ROOM 1 Gender: Female Note Status: Finalized Instrument Name: Altamese Cabal Endoscope [id number] Procedure:             Upper GI endoscopy Indications:           Dysphagia Providers:             Andrey Farmer MD, MD Medicines:             Monitored Anesthesia Care Complications:         No immediate complications. Estimated blood loss:                         Minimal. Procedure:             Pre-Anesthesia Assessment:                        - Prior to the procedure, a History and Physical was                         performed, and patient medications and allergies were                         reviewed. The patient is competent. The risks and                         benefits of the procedure and the sedation options and                         risks were discussed with the patient. All questions                         were answered and informed consent was obtained.                         Patient identification and proposed procedure were                         verified by the physician, the nurse, the                         anesthesiologist, the anesthetist and the technician                         in the pre-procedure area in the procedure room.                         Mental Status Examination: alert and oriented. Airway                         Examination: normal oropharyngeal airway and neck                         mobility. Respiratory Examination: clear to                         auscultation. CV Examination: normal. Prophylactic  Antibiotics: The patient does not require prophylactic                         antibiotics. Prior Anticoagulants: The patient has                         taken no previous anticoagulant or antiplatelet                          agents. ASA Grade Assessment: III - A patient with                         severe systemic disease. After reviewing the risks and                         benefits, the patient was deemed in satisfactory                         condition to undergo the procedure. The anesthesia                         plan was to use monitored anesthesia care (MAC).                         Immediately prior to administration of medications,                         the patient was re-assessed for adequacy to receive                         sedatives. The heart rate, respiratory rate, oxygen                         saturations, blood pressure, adequacy of pulmonary                         ventilation, and response to care were monitored                         throughout the procedure. The physical status of the                         patient was re-assessed after the procedure.                        After obtaining informed consent, the endoscope was                         passed under direct vision. Throughout the procedure,                         the patient's blood pressure, pulse, and oxygen                         saturations were monitored continuously. The Endoscope                         was introduced through the mouth, and advanced to the  second part of duodenum. The upper GI endoscopy was                         accomplished without difficulty. The patient tolerated                         the procedure well. Findings:      LA Grade A (one or more mucosal breaks less than 5 mm, not extending       between tops of 2 mucosal folds) esophagitis with no bleeding was found.       Biopsies were taken with a cold forceps for histology. Estimated blood       loss was minimal.      No endoscopic abnormality was evident in the esophagus to explain the       patient's complaint of dysphagia.      Patchy minimal inflammation characterized by erythema was found in  the       gastric antrum. Biopsies were taken with a cold forceps for Helicobacter       pylori testing. Estimated blood loss was minimal.      The examined duodenum was normal. Impression:            - LA Grade A esophagitis with no bleeding. Biopsied.                        - No endoscopic esophageal abnormality to explain                         patient's dysphagia.                        - Gastritis. Biopsied.                        - Normal examined duodenum. Recommendation:        - Discharge patient to home.                        - Resume previous diet.                        - Continue present medications.                        - Await pathology results.                        - Return to referring physician as previously                         scheduled. Procedure Code(s):     --- Professional ---                        [phone], Esophagogastroduodenoscopy, flexible,                         transoral; with biopsy, single or multiple Diagnosis Code(s):     --- Professional ---                        K20.90, Esophagitis, unspecified without bleeding  R13.10, Dysphagia, unspecified                        K29.70, Gastritis, unspecified, without bleeding CPT copyright 2019 American Medical Association. All rights reserved. The codes documented in this report are preliminary and upon coder review may  be revised to meet current compliance requirements. Andrey Farmer MD, MD 03/15/2021 9:57:49 AM Number of Addenda: 0 Note Initiated On: 03/15/2021 9:19 AM Estimated Blood Loss:  Estimated blood loss was minimal.      [facility]

## 2021-03-16 ENCOUNTER — Encounter: Payer: Self-pay | Admitting: Gastroenterology

## 2021-03-17 LAB — SURGICAL PATHOLOGY

## 2021-07-14 ENCOUNTER — Ambulatory Visit
Admission: RE | Admit: 2021-07-14 | Discharge: 2021-07-14 | Disposition: A | Discharge status: Home / Self Care | Payer: Medicare Other | Source: Ambulatory Visit | Attending: Nurse Practitioner | Admitting: Nurse Practitioner

## 2021-07-14 DIAGNOSIS — Z1231 Encounter for screening mammogram for malignant neoplasm of breast: Secondary | ICD-10-CM | POA: Diagnosis present

## 2021-09-16 ENCOUNTER — Other Ambulatory Visit: Payer: Self-pay | Admitting: Nurse Practitioner

## 2021-09-16 DIAGNOSIS — K7460 Unspecified cirrhosis of liver: Secondary | ICD-10-CM

## 2022-03-17 ENCOUNTER — Other Ambulatory Visit: Payer: Self-pay | Admitting: Nurse Practitioner

## 2022-03-17 DIAGNOSIS — R062 Wheezing: Secondary | ICD-10-CM

## 2022-03-27 ENCOUNTER — Other Ambulatory Visit: Payer: Self-pay

## 2022-03-27 ENCOUNTER — Emergency Department: Payer: Medicare Other

## 2022-03-27 DIAGNOSIS — R000 Tachycardia, unspecified: Secondary | ICD-10-CM | POA: Insufficient documentation

## 2022-03-27 DIAGNOSIS — J101 Influenza due to other identified influenza virus with other respiratory manifestations: Secondary | ICD-10-CM | POA: Insufficient documentation

## 2022-03-27 DIAGNOSIS — Z1152 Encounter for screening for COVID-19: Secondary | ICD-10-CM | POA: Insufficient documentation

## 2022-03-27 DIAGNOSIS — E860 Dehydration: Secondary | ICD-10-CM | POA: Insufficient documentation

## 2022-03-27 DIAGNOSIS — I447 Left bundle-branch block, unspecified: Secondary | ICD-10-CM | POA: Insufficient documentation

## 2022-03-27 DIAGNOSIS — R5383 Other fatigue: Secondary | ICD-10-CM | POA: Diagnosis present

## 2022-03-27 LAB — RESP PANEL BY RT-PCR (RSV, FLU A&B, COVID)  RVPGX2
Influenza A by PCR: POSITIVE — AB
Influenza B by PCR: NEGATIVE
Resp Syncytial Virus by PCR: NEGATIVE
SARS Coronavirus 2 by RT PCR: NEGATIVE

## 2022-03-27 LAB — LACTIC ACID, PLASMA: Lactic Acid, Venous: 2.6 mmol/L (ref 0.5–1.9)

## 2022-03-27 MED ORDER — SODIUM CHLORIDE 0.9 % IV BOLUS
1000.0000 mL | Freq: Once | INTRAVENOUS | Status: AC
  Administered 2022-03-28: 1000 mL via INTRAVENOUS

## 2022-03-27 MED ORDER — SODIUM CHLORIDE 0.9 % IV BOLUS
1000.0000 mL | Freq: Once | INTRAVENOUS | Status: AC
  Administered 2022-03-27: 1000 mL via INTRAVENOUS

## 2022-03-27 NOTE — ED Triage Notes (Signed)
Patient arrived via EMS. States she has been coughing since November and was diagnosed with bronchitis, then got COVID at Christmas. States she has not been able to eat and has had cough, neck and back pain. States she passed out and hit head two weeks ago and did not seek treatment. Patient with dry cough and frequently changing position in pain. Moderately labored respirations. Speaking in clear sentences. AOX4. Dry skin and dry mucous membranes.

## 2022-03-27 NOTE — ED Provider Triage Note (Signed)
Emergency Medicine Provider Triage Evaluation Note  Destiny Hernandez , a 62 y.o. female  was evaluated in triage.  Pt complains of body aches, fever, cough, decreased appetite.  She had COVID 2 weeks ago and took Paxlovid as prescribed but has not felt any better.  Cough has gotten worse.  Entire family has been sick.  Physical Exam  There were no vitals taken for this visit. Gen:   Awake, no distress   Resp:  Normal effort  MSK:   Moves extremities without difficulty  Other:    Medical Decision Making  Medically screening exam initiated at 9:12 PM.  Appropriate orders placed.  Destiny Hernandez was informed that the remainder of the evaluation will be completed by another provider, this initial triage assessment does not replace that evaluation, and the importance of remaining in the ED until their evaluation is complete.  Tachycardic, febrile. IV fluids and sepsis orders entered.   Victorino Dike, FNP 03/27/22 2352

## 2022-03-27 NOTE — ED Triage Notes (Signed)
First nurse note: pt to ER via ems with c/o weakness.  Pt was dx with covid within last 2 weeks and has been on paxlovid for last 1.5 weeks per ems.  Pt was A&O x4 with ems.    EMS VS: HR 140, BP 110/63, 96% RA.  Pt does have hx of HIV.

## 2022-03-28 ENCOUNTER — Emergency Department
Admission: EM | Admit: 2022-03-28 | Discharge: 2022-03-28 | Disposition: A | Discharge status: Home / Self Care | Payer: Medicare Other | Attending: Emergency Medicine | Admitting: Emergency Medicine

## 2022-03-28 DIAGNOSIS — J101 Influenza due to other identified influenza virus with other respiratory manifestations: Secondary | ICD-10-CM

## 2022-03-28 DIAGNOSIS — E860 Dehydration: Secondary | ICD-10-CM

## 2022-03-28 LAB — CBC WITH DIFFERENTIAL/PLATELET
Abs Immature Granulocytes: 0.04 10*3/uL (ref 0.00–0.07)
Basophils Absolute: 0 10*3/uL (ref 0.0–0.1)
Basophils Relative: 0 %
Eosinophils Absolute: 0 10*3/uL (ref 0.0–0.5)
Eosinophils Relative: 0 %
HCT: 40.5 % (ref 36.0–46.0)
Hemoglobin: 14.2 g/dL (ref 12.0–15.0)
Immature Granulocytes: 0 %
Lymphocytes Relative: 9 %
Lymphs Abs: 0.9 10*3/uL (ref 0.7–4.0)
MCH: 30.9 pg (ref 26.0–34.0)
MCHC: 35.1 g/dL (ref 30.0–36.0)
MCV: 88 fL (ref 80.0–100.0)
Monocytes Absolute: 0.6 10*3/uL (ref 0.1–1.0)
Monocytes Relative: 6 %
Neutro Abs: 9.1 10*3/uL — ABNORMAL HIGH (ref 1.7–7.7)
Neutrophils Relative %: 85 %
Platelets: 311 10*3/uL (ref 150–400)
RBC: 4.6 MIL/uL (ref 3.87–5.11)
RDW: 12.5 % (ref 11.5–15.5)
WBC: 10.7 10*3/uL — ABNORMAL HIGH (ref 4.0–10.5)
nRBC: 0 % (ref 0.0–0.2)

## 2022-03-28 LAB — COMPREHENSIVE METABOLIC PANEL
ALT: 27 U/L (ref 0–44)
AST: 38 U/L (ref 15–41)
Albumin: 4.2 g/dL (ref 3.5–5.0)
Alkaline Phosphatase: 56 U/L (ref 38–126)
Anion gap: 13 (ref 5–15)
BUN: 15 mg/dL (ref 8–23)
CO2: 24 mmol/L (ref 22–32)
Calcium: 9.7 mg/dL (ref 8.9–10.3)
Chloride: 96 mmol/L — ABNORMAL LOW (ref 98–111)
Creatinine, Ser: 1.48 mg/dL — ABNORMAL HIGH (ref 0.44–1.00)
GFR, Estimated: 40 mL/min — ABNORMAL LOW (ref >60)
Glucose, Bld: 165 mg/dL — ABNORMAL HIGH (ref 70–99)
Potassium: 3.1 mmol/L — ABNORMAL LOW (ref 3.5–5.1)
Sodium: 133 mmol/L — ABNORMAL LOW (ref 135–145)
Total Bilirubin: 1.4 mg/dL — ABNORMAL HIGH (ref 0.3–1.2)
Total Protein: 9.5 g/dL — ABNORMAL HIGH (ref 6.5–8.1)

## 2022-03-28 LAB — LACTIC ACID, PLASMA: Lactic Acid, Venous: 1.1 mmol/L (ref 0.5–1.9)

## 2022-03-28 MED ORDER — ONDANSETRON HCL 4 MG/2ML IJ SOLN
4.0000 mg | Freq: Once | INTRAMUSCULAR | Status: AC
  Administered 2022-03-28: 4 mg via INTRAVENOUS
  Filled 2022-03-28: qty 2

## 2022-03-28 MED ORDER — KETOROLAC TROMETHAMINE 15 MG/ML IJ SOLN
15.0000 mg | Freq: Once | INTRAMUSCULAR | Status: AC
  Administered 2022-03-28: 15 mg via INTRAVENOUS
  Filled 2022-03-28: qty 1

## 2022-03-28 MED ORDER — FAMOTIDINE 20 MG PO TABS: 20.0000 mg | ORAL_TABLET | Freq: Two times a day (BID) | ORAL | 0 refills | Status: AC

## 2022-03-28 MED ORDER — ONDANSETRON 4 MG PO TBDP: 4.0000 mg | ORAL_TABLET | Freq: Three times a day (TID) | ORAL | 0 refills | Status: AC | PRN

## 2022-03-28 MED ORDER — NAPROXEN 500 MG PO TABS: 500.0000 mg | ORAL_TABLET | Freq: Two times a day (BID) | ORAL | 0 refills | Status: AC

## 2022-03-28 MED ORDER — PANTOPRAZOLE SODIUM 40 MG IV SOLR
40.0000 mg | Freq: Once | INTRAVENOUS | Status: AC
  Administered 2022-03-28: 40 mg via INTRAVENOUS
  Filled 2022-03-28: qty 10

## 2022-03-28 NOTE — ED Provider Notes (Signed)
[facility] Provider Note    Event Date/Time   First MD Initiated Contact with Patient 03/28/22 0225     (approximate)   History   Chief Complaint: Weakness   HPI  Destiny Hernandez is a 62 y.o. female with no significant past medical history who comes ED complaining of fatigue and generalized bodyaches along with nonproductive cough for the past several days.  Has had decreased appetite and oral intake, feeling dehydrated.  No chest pain, no shortness of breath.  No abdominal pain or diarrhea.  After triage patient was given 2 L IV fluids for hydration.     Physical Exam   Triage Vital Signs: ED Triage Vitals  Enc Vitals Group     BP 03/27/22 2120 109/71     Pulse Rate 03/27/22 2120 (!) 118     Resp 03/27/22 2120 (!) 24     Temp 03/27/22 2120 99.2 F (37.3 C)     Temp Source 03/27/22 2120 Oral     SpO2 03/27/22 2120 100 %     Weight 03/27/22 2121 144 lb (65.3 kg)     Height 03/27/22 2121 5\' 2"  (1.575 m)     Head Circumference --      Peak Flow --      Pain Score 03/27/22 2120 8     Pain Loc --      Pain Edu? --      Excl. in GC? --     Most recent vital signs: Vitals:   03/27/22 2120 03/28/22 0051  BP: 109/71 106/64  Pulse: (!) 118 97  Resp: (!) 24 (!) 22  Temp: 99.2 F (37.3 C) 99.1 F (37.3 C)  SpO2: 100% 100%    General: Awake, no distress.  CV:  Good peripheral perfusion.  Regular rate and rhythm Resp:  Normal effort.  Abd:  No distention.  Other:  Moist mucosa   ED Results / Procedures / Treatments   Labs (all labs ordered are listed, but only abnormal results are displayed) Labs Reviewed  RESP PANEL BY RT-PCR (RSV, FLU A&B, COVID)  RVPGX2 - Abnormal; Notable for the following components:      Result Value   Influenza A by PCR POSITIVE (*)    All other components within normal limits  COMPREHENSIVE METABOLIC PANEL - Abnormal; Notable for the following components:   Sodium 133 (*)    Potassium 3.1 (*)    Chloride 96  (*)    Glucose, Bld 165 (*)    Creatinine, Ser 1.48 (*)    Total Protein 9.5 (*)    Total Bilirubin 1.4 (*)    GFR, Estimated 40 (*)    All other components within normal limits  LACTIC ACID, PLASMA - Abnormal; Notable for the following components:   Lactic Acid, Venous 2.6 (*)    All other components within normal limits  LACTIC ACID, PLASMA  CBC WITH DIFFERENTIAL/PLATELET  URINALYSIS, ROUTINE W REFLEX MICROSCOPIC     EKG Interpreted by me Sinus tachycardia rate 118.  Left axis, left bundle branch block.  No acute ischemic changes.   RADIOLOGY Chest x-ray interpreted by me, appears normal.  Radiology report reviewed   PROCEDURES:  Procedures   MEDICATIONS ORDERED IN ED: Medications  ketorolac (TORADOL) 15 MG/ML injection 15 mg (has no administration in time range)  ondansetron (ZOFRAN) injection 4 mg (has no administration in time range)  pantoprazole (PROTONIX) injection 40 mg (has no administration in time range)  sodium chloride 0.9 %  bolus 1,000 mL (0 mLs Intravenous Stopped 03/27/22 2325)  sodium chloride 0.9 % bolus 1,000 mL (1,000 mLs Intravenous New Bag/Given 03/28/22 0035)     IMPRESSION / MDM / ASSESSMENT AND PLAN / ED COURSE  I reviewed the triage vital signs and the nursing notes.                              Differential diagnosis includes, but is not limited to, viral illness, pneumonia dehydration, electrolyte abnormality, AKI  Patient's presentation is most consistent with acute presentation with potential threat to life or bodily function.  Patient presents with constitutional symptoms suggestive of a flulike illness.  Patient is positive for influenza A.  Vital signs initially showed tachycardia, which has improved with IV fluid hydration.  Serum labs are reassuring, lactate was mildly abnormal and now normalized after IV fluids.  Patient does not require admission, stable for discharge home.  Will prescribe medications for supportive care.        FINAL CLINICAL IMPRESSION(S) / ED DIAGNOSES   Final diagnoses:  Influenza A  Dehydration     Rx / DC Orders   ED Discharge Orders          Ordered    naproxen (NAPROSYN) 500 MG tablet  2 times daily with meals        03/28/22 0306    ondansetron (ZOFRAN-ODT) 4 MG disintegrating tablet  Every 8 hours PRN        03/28/22 0306    famotidine (PEPCID) 20 MG tablet  2 times daily        03/28/22 0306             Note:  This document was prepared using Dragon voice recognition software and may include unintentional dictation errors.   Carrie Mew, MD 03/28/22 [phone]

## 2022-04-14 ENCOUNTER — Other Ambulatory Visit: Payer: Self-pay | Admitting: Nurse Practitioner

## 2022-04-14 DIAGNOSIS — K7460 Unspecified cirrhosis of liver: Secondary | ICD-10-CM

## 2022-04-21 ENCOUNTER — Ambulatory Visit
Admission: RE | Admit: 2022-04-21 | Discharge: 2022-04-21 | Disposition: A | Discharge status: Home / Self Care | Payer: Medicare Other | Source: Ambulatory Visit | Attending: Nurse Practitioner | Admitting: Nurse Practitioner

## 2022-04-21 DIAGNOSIS — K7460 Unspecified cirrhosis of liver: Secondary | ICD-10-CM | POA: Diagnosis present

## 2022-06-28 ENCOUNTER — Other Ambulatory Visit: Payer: Self-pay | Admitting: Nurse Practitioner

## 2022-06-28 DIAGNOSIS — Z1231 Encounter for screening mammogram for malignant neoplasm of breast: Secondary | ICD-10-CM

## 2022-10-04 ENCOUNTER — Other Ambulatory Visit: Payer: Self-pay | Admitting: Nurse Practitioner

## 2022-10-04 DIAGNOSIS — Z8719 Personal history of other diseases of the digestive system: Secondary | ICD-10-CM

## 2023-08-10 ENCOUNTER — Other Ambulatory Visit: Payer: Self-pay | Admitting: Nurse Practitioner

## 2023-08-10 DIAGNOSIS — K7469 Other cirrhosis of liver: Secondary | ICD-10-CM

## 2023-08-21 ENCOUNTER — Other Ambulatory Visit: Payer: Self-pay | Admitting: Nurse Practitioner

## 2023-08-21 ENCOUNTER — Ambulatory Visit
Admission: RE | Admit: 2023-08-21 | Discharge: 2023-08-21 | Disposition: A | Discharge status: Home / Self Care | Source: Ambulatory Visit | Attending: Nurse Practitioner | Admitting: Nurse Practitioner

## 2023-08-21 DIAGNOSIS — J069 Acute upper respiratory infection, unspecified: Secondary | ICD-10-CM | POA: Diagnosis present

## 2023-08-21 DIAGNOSIS — R059 Cough, unspecified: Secondary | ICD-10-CM

## 2023-08-21 DIAGNOSIS — K7469 Other cirrhosis of liver: Secondary | ICD-10-CM | POA: Insufficient documentation

## 2023-08-24 ENCOUNTER — Ambulatory Visit
Admission: EM | Admit: 2023-08-24 | Discharge: 2023-08-24 | Disposition: A | Discharge status: Home / Self Care | Attending: Emergency Medicine | Admitting: Emergency Medicine

## 2023-08-24 DIAGNOSIS — W5501XA Bitten by cat, initial encounter: Secondary | ICD-10-CM

## 2023-08-24 DIAGNOSIS — S61431A Puncture wound without foreign body of right hand, initial encounter: Secondary | ICD-10-CM | POA: Diagnosis not present

## 2023-08-24 DIAGNOSIS — S61451A Open bite of right hand, initial encounter: Secondary | ICD-10-CM

## 2023-08-24 DIAGNOSIS — Z23 Encounter for immunization: Secondary | ICD-10-CM | POA: Diagnosis not present

## 2023-08-24 MED ORDER — AMOXICILLIN-POT CLAVULANATE 875-125 MG PO TABS: 1.0000 | ORAL_TABLET | Freq: Two times a day (BID) | ORAL | 0 refills | Status: AC

## 2023-08-24 MED ORDER — PREDNISONE 20 MG PO TABS: 40.0000 mg | ORAL_TABLET | Freq: Every day | ORAL | 0 refills | Status: AC

## 2023-08-24 MED ORDER — TETANUS-DIPHTH-ACELL PERTUSSIS 5-2.5-18.5 LF-MCG/0.5 IM SUSY
0.5000 mL | PREFILLED_SYRINGE | Freq: Once | INTRAMUSCULAR | Status: AC
  Administered 2023-08-24: 0.5 mL via INTRAMUSCULAR

## 2023-08-24 MED ORDER — RABIES VACCINE, PCEC IM SUSR
1.0000 mL | Freq: Once | INTRAMUSCULAR | Status: AC
  Administered 2023-08-24: 1 mL via INTRAMUSCULAR

## 2023-08-24 MED ORDER — RABIES IMMUNE GLOBULIN 1500 UNIT/10ML IJ SOLN
20.0000 [IU]/kg | Freq: Once | INTRAMUSCULAR | Status: AC
Start: 2023-08-24 — End: 2023-08-24
  Administered 2023-08-24: 1500 [IU]

## 2023-08-24 NOTE — Discharge Instructions (Addendum)
 Your have been  evaluated for cat bite  Cat mouths are considered to be dirty and therefore you will be placed on antibiotics to prevent infection, take Augmentin twice daily for 7 days  Cleanse wound daily with soap and water , pat it do not rub, may leave open to air or you may cover with a nonadherent dressing if at risk for contamination  You have been given rabies vaccine series as cat is a stray, return on the following days for remaining 3 injections Day 3 June 9 Day 7 June 13 Day 14, June 20  Tetanus shot has been updated, good for 10 years  Take Tylenol and or Motrin as needed for pain  If you have any concerns regarding healing please follow-up for reevaluation

## 2023-08-24 NOTE — ED Provider Notes (Signed)
 Destiny Hernandez    CSN: [id number] Arrival date & time: 08/24/23  1747      History   Chief Complaint Chief Complaint  Patient presents with   Animal Bite    HPI Destiny Hernandez is a 63 y.o. female.   Patient presents for evaluation for cat bite to the right hand that occurred 1 day ago.  Has been feeding a stray cat in her neighborhood when cat became aggressive and bit her, not his typical behavior.  Last tetanus 10 years ago.  Past Medical History:  Diagnosis Date   Depression    GERD (gastroesophageal reflux disease)    HIV (human immunodeficiency virus infection) (HCC)    Wears dentures    full upper and lower, doesn't wear    Patient Active Problem List   Diagnosis Date Noted   Encounter for screening colonoscopy    Gastroesophageal reflux disease without esophagitis    Esophageal dysphagia     Past Surgical History:  Procedure Laterality Date   CHOLECYSTECTOMY     COLONOSCOPY     COLONOSCOPY WITH PROPOFOL  N/A 06/06/2019   Procedure: COLONOSCOPY WITH PROPOFOL ;  Surgeon: Selena Daily, MD;  Location: [facility] ENDOSCOPY;  Service: Gastroenterology;  Laterality: N/A;   ESOPHAGOGASTRODUODENOSCOPY N/A 03/15/2021   Procedure: ESOPHAGOGASTRODUODENOSCOPY (EGD);  Surgeon: Shane Darling, MD;  Location: [facility] ENDOSCOPY;  Service: Endoscopy;  Laterality: N/A;   ESOPHAGOGASTRODUODENOSCOPY (EGD) WITH PROPOFOL  N/A 09/04/2018   Procedure: ESOPHAGOGASTRODUODENOSCOPY (EGD) WITH PROPOFOL ;  Surgeon: Selena Daily, MD;  Location: [facility];  Service: Endoscopy;  Laterality: N/A;    OB History   No obstetric history on file.      Home Medications    Prior to Admission medications   Medication Sig Start Date End Date Taking? Authorizing Provider  ACAI BERRY PO Take by mouth daily.   Yes [provider]  amoxicillin-clavulanate (AUGMENTIN) 875-125 MG tablet Take 1 tablet by mouth every 12 (twelve) hours. 08/24/23  Yes Kynslee Baham R, NP   bictegravir-emtricitabine-tenofovir AF (BIKTARVY) 50-200-25 MG TABS tablet Take by mouth daily.   Yes [provider]  esomeprazole (NEXIUM) 40 MG capsule  04/12/18  Yes [provider]  famotidine  (PEPCID ) 20 MG tablet Take 1 tablet (20 mg total) by mouth 2 (two) times daily. 03/28/22  Yes Jacquie Maudlin, MD  naproxen  (NAPROSYN ) 500 MG tablet Take 1 tablet (500 mg total) by mouth 2 (two) times daily with a meal. 03/28/22  Yes Jacquie Maudlin, MD  omeprazole (PRILOSEC) 20 MG capsule Take 20 mg by mouth daily.   Yes [provider]  ondansetron  (ZOFRAN -ODT) 4 MG disintegrating tablet Take 1 tablet (4 mg total) by mouth every 8 (eight) hours as needed for nausea or vomiting. 03/28/22  Yes Jacquie Maudlin, MD  predniSONE (DELTASONE) 20 MG tablet Take 2 tablets (40 mg total) by mouth daily. 08/24/23  Yes Reena Canning, NP  traZODone (DESYREL) 50 MG tablet  08/02/18  Yes [provider]    Family History History reviewed. No pertinent family history.  Social History Social History   Tobacco Use   Smoking status: Never   Smokeless tobacco: Never  Vaping Use   Vaping status: Never Used  Substance Use Topics   Alcohol use: Never   Drug use: Never     Allergies   Omeprazole   Review of Systems Review of Systems   Physical Exam Triage Vital Signs ED Triage Vitals  Encounter Vitals Group     BP 08/24/23  1800 132/70     Systolic BP Percentile --      Diastolic BP Percentile --      Pulse --      Resp --      Temp 08/24/23 1800 98.9 F (37.2 C)     Temp Source 08/24/23 1800 Oral     SpO2 08/24/23 1800 97 %     Weight 08/24/23 1759 155 lb (70.3 kg)     Height 08/24/23 1759 5\' 2"  (1.575 m)     Head Circumference --      Peak Flow --      Pain Score 08/24/23 1803 2     Pain Loc --      Pain Education --      Exclude from Growth Chart --    No data found.  Updated Vital Signs BP 132/70 (BP Location: Left Arm)   Temp 98.9 F (37.2 C)  (Oral)   Ht 5\' 2"  (1.575 m)   Wt 155 lb (70.3 kg)   SpO2 97%   BMI 28.35 kg/m   Visual Acuity Right Eye Distance:   Left Eye Distance:   Bilateral Distance:    Right Eye Near:   Left Eye Near:    Bilateral Near:     Physical Exam Constitutional:      Appearance: Normal appearance.  Eyes:     Extraocular Movements: Extraocular movements intact.  Pulmonary:     Effort: Pulmonary effort is normal.  Skin:    Comments: Puncture wounds present to the medial aspect of the right hand, circular in shape consistent with mouth, erythematous and swollen, tender to touch, no drainage present   Neurological:     Mental Status: She is alert and oriented to person, place, and time. Mental status is at baseline.      UC Treatments / Results  Labs (all labs ordered are listed, but only abnormal results are displayed) Labs Reviewed - No data to display  EKG   Radiology No results found.  Procedures Procedures (including critical care time)  Medications Ordered in UC Medications  Rabies Immune Globulin SOLN 1,500 Units (1,500 Units Infiltration Given by Other 08/24/23 1823)  rabies vaccine (RABAVERT) injection 1 mL (1 mL Intramuscular Given 08/24/23 1830)  Tdap (BOOSTRIX) injection 0.5 mL (0.5 mLs Intramuscular Given 08/24/23 1830)    Initial Impression / Assessment and Plan / UC Course  I have reviewed the triage vital signs and the nursing notes.  Pertinent labs & imaging results that were available during my care of the patient were reviewed by me and considered in my medical decision making (see chart for details).  Puncture wound of right hand, cat bite of right hand  Erythematous and swollen on exam, initiated Augmentin as well as prednisone for management of pain, may additionally take Tylenol, advise daily cleansing with covering as needed for if at risk for contamination, rabies immunoglobulin, VAC seen and tetanus given today in clinic, advise follow-up for any further  concerns Final Clinical Impressions(s) / UC Diagnoses   Final diagnoses:  Cat bite of right hand, initial encounter  Puncture wound of finger of right hand, initial encounter   Discharge Instructions      Your have been  evaluated for cat bite  Cat mouths are considered to be dirty and therefore you will be placed on antibiotics to prevent infection, take Augmentin twice daily for 7 days  Cleanse wound daily with soap and water , pat it do not rub, may leave open  to air or you may cover with a nonadherent dressing if at risk for contamination  You have been given rabies vaccine series as cat is a stray, return on the following days for remaining 3 injections Day 3 June 9 Day 7 June 13 Day 14, June 20  Tetanus shot has been updated, good for 10 years  Take Tylenol and or Motrin as needed for pain  If you have any concerns regarding healing please follow-up for reevaluation  ED Prescriptions     Medication Sig Dispense Auth. Provider   amoxicillin-clavulanate (AUGMENTIN) 875-125 MG tablet Take 1 tablet by mouth every 12 (twelve) hours. 14 tablet Olden Klauer R, NP   predniSONE (DELTASONE) 20 MG tablet Take 2 tablets (40 mg total) by mouth daily. 10 tablet Maritssa Haughton R, NP      PDMP not reviewed this encounter.   Reena Canning, NP 08/24/23 [phone]

## 2023-08-24 NOTE — ED Triage Notes (Signed)
 Pt c/o cat bite to right hand on 08/23/23  Pt states that the cat was a stray that often visits her home  Pt states that the cat had bit her suddenly and the cat normally does not have an aggressive behavior.   Pt states that her last tetanus was years ago

## 2023-08-27 ENCOUNTER — Ambulatory Visit
Admission: RE | Admit: 2023-08-27 | Discharge: 2023-08-27 | Disposition: A | Discharge status: Home / Self Care | Source: Ambulatory Visit | Attending: Emergency Medicine | Admitting: Emergency Medicine

## 2023-08-27 DIAGNOSIS — Z203 Contact with and (suspected) exposure to rabies: Secondary | ICD-10-CM | POA: Diagnosis not present

## 2023-08-27 DIAGNOSIS — Z23 Encounter for immunization: Secondary | ICD-10-CM

## 2023-08-27 MED ORDER — RABIES VACCINE, PCEC IM SUSR
1.0000 mL | Freq: Once | INTRAMUSCULAR | Status: AC
  Administered 2023-08-27: 1 mL via INTRAMUSCULAR

## 2023-08-27 NOTE — ED Triage Notes (Signed)
 Patient here for day 3 rabies vaccine  after feral cat bite.  Patient seen at this facility 6/6. Denies any other complaints. Tolerated initial vaccines well.

## 2023-08-31 ENCOUNTER — Ambulatory Visit
Admission: RE | Admit: 2023-08-31 | Discharge: 2023-08-31 | Disposition: A | Discharge status: Home / Self Care | Source: Ambulatory Visit | Attending: Emergency Medicine

## 2023-08-31 DIAGNOSIS — Z23 Encounter for immunization: Secondary | ICD-10-CM | POA: Diagnosis not present

## 2023-08-31 DIAGNOSIS — Z203 Contact with and (suspected) exposure to rabies: Secondary | ICD-10-CM | POA: Diagnosis not present

## 2023-08-31 MED ORDER — RABIES VACCINE, PCEC IM SUSR
1.0000 mL | Freq: Once | INTRAMUSCULAR | Status: AC
  Administered 2023-08-31: 1 mL via INTRAMUSCULAR

## 2023-08-31 NOTE — ED Triage Notes (Signed)
 Patient here for day 7 rabies shot in rabies series after feral cat bite.  Denies any other complaints. Tolerated other vaccines well.

## 2023-09-07 ENCOUNTER — Ambulatory Visit

## 2024-02-04 ENCOUNTER — Other Ambulatory Visit: Payer: Self-pay

## 2024-02-04 DIAGNOSIS — Z1231 Encounter for screening mammogram for malignant neoplasm of breast: Secondary | ICD-10-CM

## 2024-02-04 DIAGNOSIS — K7460 Unspecified cirrhosis of liver: Secondary | ICD-10-CM

## 2024-03-17 ENCOUNTER — Ambulatory Visit

## 2024-03-21 ENCOUNTER — Ambulatory Visit
Admission: RE | Admit: 2024-03-21 | Discharge: 2024-03-21 | Disposition: A | Discharge status: Home / Self Care | Source: Ambulatory Visit

## 2024-03-21 DIAGNOSIS — Z1231 Encounter for screening mammogram for malignant neoplasm of breast: Secondary | ICD-10-CM | POA: Insufficient documentation

## 2024-03-26 ENCOUNTER — Other Ambulatory Visit: Payer: Self-pay

## 2024-03-26 DIAGNOSIS — R928 Other abnormal and inconclusive findings on diagnostic imaging of breast: Secondary | ICD-10-CM

## 2024-03-27 ENCOUNTER — Ambulatory Visit
Admission: RE | Admit: 2024-03-27 | Discharge: 2024-03-27 | Disposition: A | Discharge status: Home / Self Care | Source: Ambulatory Visit

## 2024-03-27 DIAGNOSIS — R928 Other abnormal and inconclusive findings on diagnostic imaging of breast: Secondary | ICD-10-CM
